# Patient Record
Sex: Male | Born: 1960 | Race: Black or African American | Hispanic: No | Marital: Single | Smoking: Current every day smoker
Health system: Southern US, Community
[De-identification: ages and names within clinical notes are randomized; demographics above are authoritative.]

## PROBLEM LIST (undated history)

## (undated) HISTORY — PX: OTHER SURGICAL HISTORY: SHX169

---

## 2018-02-21 ENCOUNTER — Emergency Department
Admission: EM | Admit: 2018-02-21 | Discharge: 2018-02-21 | Disposition: A | Discharge status: Home / Self Care | Payer: Self-pay | Attending: Emergency Medicine | Admitting: Emergency Medicine

## 2018-02-21 ENCOUNTER — Encounter: Payer: Self-pay | Admitting: Emergency Medicine

## 2018-02-21 DIAGNOSIS — R109 Unspecified abdominal pain: Secondary | ICD-10-CM

## 2018-02-21 DIAGNOSIS — F17210 Nicotine dependence, cigarettes, uncomplicated: Secondary | ICD-10-CM | POA: Insufficient documentation

## 2018-02-21 DIAGNOSIS — R1084 Generalized abdominal pain: Secondary | ICD-10-CM | POA: Insufficient documentation

## 2018-02-21 DIAGNOSIS — M799 Soft tissue disorder, unspecified: Secondary | ICD-10-CM | POA: Insufficient documentation

## 2018-02-21 LAB — URINALYSIS, COMPLETE (UACMP) WITH MICROSCOPIC
BILIRUBIN URINE: NEGATIVE
Bacteria, UA: NONE SEEN
Glucose, UA: NEGATIVE mg/dL
Hgb urine dipstick: NEGATIVE
KETONES UR: NEGATIVE mg/dL
Leukocytes, UA: NEGATIVE
Nitrite: NEGATIVE
PH: 7 (ref 5.0–8.0)
Protein, ur: NEGATIVE mg/dL
SPECIFIC GRAVITY, URINE: 1.013 (ref 1.005–1.030)
SQUAMOUS EPITHELIAL / LPF: NONE SEEN (ref 0–5)
WBC UA: NONE SEEN WBC/hpf (ref 0–5)

## 2018-02-21 LAB — BASIC METABOLIC PANEL
ANION GAP: 9 (ref 5–15)
BUN: 10 mg/dL (ref 6–20)
CALCIUM: 8.8 mg/dL — AB (ref 8.9–10.3)
CO2: 23 mmol/L (ref 22–32)
CREATININE: 0.89 mg/dL (ref 0.61–1.24)
Chloride: 108 mmol/L (ref 101–111)
GFR calc Af Amer: >60 mL/min (ref >60)
GFR calc non Af Amer: >60 mL/min (ref >60)
Glucose, Bld: 103 mg/dL — ABNORMAL HIGH (ref 65–99)
Potassium: 3.5 mmol/L (ref 3.5–5.1)
SODIUM: 140 mmol/L (ref 135–145)

## 2018-02-21 LAB — CBC
HCT: 39.4 % — ABNORMAL LOW (ref 40.0–52.0)
HEMOGLOBIN: 13.7 g/dL (ref 13.0–18.0)
MCH: 27.9 pg (ref 26.0–34.0)
MCHC: 34.9 g/dL (ref 32.0–36.0)
MCV: 80 fL (ref 80.0–100.0)
PLATELETS: 220 10*3/uL (ref 150–440)
RBC: 4.92 MIL/uL (ref 4.40–5.90)
RDW: 14.5 % (ref 11.5–14.5)
WBC: 11 10*3/uL — AB (ref 3.8–10.6)

## 2018-02-21 LAB — HEPATIC FUNCTION PANEL
ALBUMIN: 3.9 g/dL (ref 3.5–5.0)
ALK PHOS: 55 U/L (ref 38–126)
ALT: 20 U/L (ref 17–63)
AST: 22 U/L (ref 15–41)
Bilirubin, Direct: 0.1 mg/dL (ref 0.1–0.5)
Indirect Bilirubin: 0.9 mg/dL (ref 0.3–0.9)
TOTAL PROTEIN: 7.6 g/dL (ref 6.5–8.1)
Total Bilirubin: 1 mg/dL (ref 0.3–1.2)

## 2018-02-21 LAB — LIPASE, BLOOD: Lipase: 25 U/L (ref 11–51)

## 2018-02-21 MED ORDER — ONDANSETRON 4 MG PO TBDP: 4.0000 mg | ORAL_TABLET | Freq: Three times a day (TID) | ORAL | 0 refills | Status: DC | PRN

## 2018-02-21 MED ORDER — KETOROLAC TROMETHAMINE 60 MG/2ML IM SOLN
15.0000 mg | Freq: Once | INTRAMUSCULAR | Status: AC
  Administered 2018-02-21: 15 mg via INTRAMUSCULAR
  Filled 2018-02-21: qty 2

## 2018-02-21 MED ORDER — NAPROXEN 500 MG PO TABS: 500.0000 mg | ORAL_TABLET | Freq: Two times a day (BID) | ORAL | 0 refills | Status: DC

## 2018-02-21 NOTE — ED Triage Notes (Signed)
Pt arrived with complaints of right sided flank pain that started 2 days ago. Pt denies any difficulty urinating.

## 2018-02-21 NOTE — ED Notes (Signed)
DC instructions discussed with patient, informed him to follow up with [facility] if needed and call Dr. Dahlia Byes from surgery for removal of lump on back. Pt verbalized understanding.  Prescriptions given to patient.

## 2018-02-21 NOTE — ED Provider Notes (Addendum)
[facility] Emergency Department Provider Note  ____________________________________________  Time seen: Approximately 10:59 AM  I have reviewed the triage vital signs and the nursing notes.   HISTORY  Chief Complaint Flank Pain    HPI Julian Lopez is a 57 y.o. male with no significant past medical history who complains of right flank pain for the past 2 days.  Reports that it is constant, waxing waning, no aggravating or alleviating factors.  Feels sharp.  Nonradiating.      History reviewed. No pertinent past medical history.   There are no active problems to display for this patient.    History reviewed. No pertinent surgical history.   Prior to Admission medications   Medication Sig Start Date End Date Taking? Authorizing Provider  naproxen (NAPROSYN) 500 MG tablet Take 1 tablet (500 mg total) by mouth 2 (two) times daily with a meal. 02/21/18   Carrie Mew, MD  ondansetron (ZOFRAN ODT) 4 MG disintegrating tablet Take 1 tablet (4 mg total) by mouth every 8 (eight) hours as needed for nausea or vomiting. 02/21/18   Carrie Mew, MD     Allergies Patient has no allergy information on record.   No family history on file.  Social History Social History   Tobacco Use  . Smoking status: Current Every Day Smoker  . Smokeless tobacco: Never Used  Substance Use Topics  . Alcohol use: Yes  . Drug use: Never    Review of Systems  Constitutional:   No fever or chills.  ENT:   No sore throat. No rhinorrhea. Cardiovascular:   No chest pain or syncope. Respiratory:   No dyspnea or cough. Gastrointestinal:   Positive as above for flank pain without vomiting and diarrhea.  Normal bowel movement yesterday Musculoskeletal:   Negative for focal pain or swelling All other systems reviewed and are negative except as documented above in ROS and HPI.  ____________________________________________   PHYSICAL EXAM:  VITAL SIGNS: ED  Triage Vitals  Enc Vitals Group     BP 02/21/18 0726 (!) 129/92     Pulse Rate 02/21/18 0726 70     Resp 02/21/18 0726 16     Temp 02/21/18 0726 (!) 97.4 F (36.3 C)     Temp Source 02/21/18 0726 Oral     SpO2 02/21/18 0726 99 %     Weight 02/21/18 0727 180 lb (81.6 kg)     Height 02/21/18 0727 5' 9.5" (1.765 m)     Head Circumference --      Peak Flow --      Pain Score 02/21/18 0727 10     Pain Loc --      Pain Edu? --      Excl. in [place]? --     Vital signs reviewed, nursing assessments reviewed.   Constitutional:   Alert and oriented. Non-toxic appearance. Eyes:   Conjunctivae are normal. EOMI. PERRL. ENT      Head:   Normocephalic and atraumatic.      Nose:   No congestion/rhinnorhea.       Mouth/Throat:   MMM, no pharyngeal erythema. No peritonsillar mass.       Neck:   No meningismus. Full ROM. Hematological/Lymphatic/Immunilogical:   No cervical lymphadenopathy. Cardiovascular:   RRR. Symmetric bilateral radial and DP pulses.  No murmurs.  Respiratory:   Normal respiratory effort without tachypnea/retractions. Breath sounds are clear and equal bilaterally. No wheezes/rales/rhonchi. Gastrointestinal:   Soft and nontender. Non distended. There is no CVA  tenderness.  No rebound, rigidity, or guarding.  Musculoskeletal:   Normal range of motion in all extremities. No joint effusions.  No lower extremity tenderness.  No edema.  There is a 1 cm soft tissue nodule in the area of indicated pain that is exquisitely tender.  No inflammatory changes, no fluctuance.  No crepitus.  It is mobile. Neurologic:   Normal speech and language.  Motor grossly intact. No acute focal neurologic deficits are appreciated.  Skin:    Skin is warm, dry and intact. No rash noted.  No petechiae, purpura, or bullae.  ____________________________________________    LABS (pertinent positives/negatives) (all labs ordered are listed, but only abnormal results are displayed) Labs Reviewed   URINALYSIS, COMPLETE (UACMP) WITH MICROSCOPIC - Abnormal; Notable for the following components:      Result Value   Color, Urine YELLOW (*)    APPearance CLOUDY (*)    All other components within normal limits  BASIC METABOLIC PANEL - Abnormal; Notable for the following components:   Glucose, Bld 103 (*)    Calcium 8.8 (*)    All other components within normal limits  CBC - Abnormal; Notable for the following components:   WBC 11.0 (*)    HCT 39.4 (*)    All other components within normal limits  HEPATIC FUNCTION PANEL  LIPASE, BLOOD   ____________________________________________   EKG    ____________________________________________    RADIOLOGY  No results found.  ____________________________________________   PROCEDURES Procedures  ____________________________________________  DIFFERENTIAL DIAGNOSIS   Kidney stone, biliary colic, gas pain, constipation  CLINICAL IMPRESSION / ASSESSMENT AND PLAN / ED COURSE  Pertinent labs & imaging results that were available during my care of the patient were reviewed by me and considered in my medical decision making (see chart for details).      Clinical Course as of Feb 21 1122  Wed Feb 21, 2018  0923 Labs are normal.  Right upper quadrant tenderness suggestive of biliary disease, but patient has no underlying comorbidities or reason to be immunocompromised or have blunted symptoms.  He is eating normally, well-appearing, normal vital signs.  No imaging needed at this time, would plan to follow-up with primary care.   [PS]    Clinical Course User Index [PS] Carrie Mew, MD     ----------------------------------------- 11:05 AM on 02/21/2018 -----------------------------------------  On repeat assessment the patient is completely nontender although he reports the pain is still there.  Most likely due to asymptomatic soft tissue nodule which is almost certainly benign.  Considering the patient's symptoms,  medical history, and physical examination today, I have low suspicion for cholecystitis or biliary pathology, pancreatitis, perforation or bowel obstruction, hernia, intra-abdominal abscess, AAA or dissection, volvulus or intussusception, mesenteric ischemia, or appendicitis.  Patient instructed to follow-up in surgery clinic for further evaluation and possible excision if warranted.    ____________________________________________   FINAL CLINICAL IMPRESSION(S) / ED DIAGNOSES    Final diagnoses:  Right flank pain  Soft tissue nodule   ED Discharge Orders        Ordered    naproxen (NAPROSYN) 500 MG tablet  2 times daily with meals     02/21/18 1058    ondansetron (ZOFRAN ODT) 4 MG disintegrating tablet  Every 8 hours PRN     02/21/18 1058      Portions of this note were generated with dragon dictation software. Dictation errors may occur despite best attempts at proofreading.    Carrie Mew, MD 02/21/18 1105  Carrie Mew, MD 02/21/18 1123

## 2018-02-21 NOTE — Discharge Instructions (Addendum)
Labs today are unremarkable.  Your urine test also looks normal.  Follow-up with primary care to continue monitoring the symptoms.  Take naproxen and Zofran as needed.

## 2019-06-29 ENCOUNTER — Encounter: Payer: Self-pay | Admitting: Emergency Medicine

## 2019-06-29 ENCOUNTER — Observation Stay
Admission: EM | Admit: 2019-06-29 | Discharge: 2019-06-30 | Disposition: A | Discharge status: Home / Self Care | Payer: Self-pay | Attending: Internal Medicine | Admitting: Internal Medicine

## 2019-06-29 ENCOUNTER — Emergency Department: Payer: Self-pay

## 2019-06-29 ENCOUNTER — Other Ambulatory Visit: Payer: Self-pay

## 2019-06-29 DIAGNOSIS — Z791 Long term (current) use of non-steroidal anti-inflammatories (NSAID): Secondary | ICD-10-CM | POA: Insufficient documentation

## 2019-06-29 DIAGNOSIS — N400 Enlarged prostate without lower urinary tract symptoms: Secondary | ICD-10-CM | POA: Insufficient documentation

## 2019-06-29 DIAGNOSIS — Z79899 Other long term (current) drug therapy: Secondary | ICD-10-CM | POA: Insufficient documentation

## 2019-06-29 DIAGNOSIS — M62838 Other muscle spasm: Secondary | ICD-10-CM | POA: Insufficient documentation

## 2019-06-29 DIAGNOSIS — S2242XA Multiple fractures of ribs, left side, initial encounter for closed fracture: Principal | ICD-10-CM | POA: Insufficient documentation

## 2019-06-29 DIAGNOSIS — F17200 Nicotine dependence, unspecified, uncomplicated: Secondary | ICD-10-CM | POA: Insufficient documentation

## 2019-06-29 DIAGNOSIS — K449 Diaphragmatic hernia without obstruction or gangrene: Secondary | ICD-10-CM | POA: Insufficient documentation

## 2019-06-29 DIAGNOSIS — R0781 Pleurodynia: Secondary | ICD-10-CM

## 2019-06-29 DIAGNOSIS — S2239XA Fracture of one rib, unspecified side, initial encounter for closed fracture: Secondary | ICD-10-CM | POA: Diagnosis present

## 2019-06-29 DIAGNOSIS — M5136 Other intervertebral disc degeneration, lumbar region: Secondary | ICD-10-CM | POA: Insufficient documentation

## 2019-06-29 DIAGNOSIS — W010XXA Fall on same level from slipping, tripping and stumbling without subsequent striking against object, initial encounter: Secondary | ICD-10-CM | POA: Insufficient documentation

## 2019-06-29 DIAGNOSIS — Z20828 Contact with and (suspected) exposure to other viral communicable diseases: Secondary | ICD-10-CM | POA: Insufficient documentation

## 2019-06-29 DIAGNOSIS — W19XXXA Unspecified fall, initial encounter: Secondary | ICD-10-CM

## 2019-06-29 LAB — COMPREHENSIVE METABOLIC PANEL
ALT: 23 U/L (ref 0–44)
AST: 18 U/L (ref 15–41)
Albumin: 4.2 g/dL (ref 3.5–5.0)
Alkaline Phosphatase: 51 U/L (ref 38–126)
Anion gap: 9 (ref 5–15)
BUN: 13 mg/dL (ref 6–20)
CO2: 24 mmol/L (ref 22–32)
Calcium: 9.4 mg/dL (ref 8.9–10.3)
Chloride: 108 mmol/L (ref 98–111)
Creatinine, Ser: 1.19 mg/dL (ref 0.61–1.24)
GFR calc Af Amer: >60 mL/min (ref >60)
GFR calc non Af Amer: >60 mL/min (ref >60)
Glucose, Bld: 106 mg/dL — ABNORMAL HIGH (ref 70–99)
Potassium: 4.8 mmol/L (ref 3.5–5.1)
Sodium: 141 mmol/L (ref 135–145)
Total Bilirubin: 0.6 mg/dL (ref 0.3–1.2)
Total Protein: 8 g/dL (ref 6.5–8.1)

## 2019-06-29 LAB — CBC WITH DIFFERENTIAL/PLATELET
Abs Immature Granulocytes: 0.02 10*3/uL (ref 0.00–0.07)
Basophils Absolute: 0 10*3/uL (ref 0.0–0.1)
Basophils Relative: 0 %
Eosinophils Absolute: 0 10*3/uL (ref 0.0–0.5)
Eosinophils Relative: 0 %
HCT: 41.5 % (ref 39.0–52.0)
Hemoglobin: 13.5 g/dL (ref 13.0–17.0)
Immature Granulocytes: 0 %
Lymphocytes Relative: 16 %
Lymphs Abs: 1.1 10*3/uL (ref 0.7–4.0)
MCH: 26.5 pg (ref 26.0–34.0)
MCHC: 32.5 g/dL (ref 30.0–36.0)
MCV: 81.4 fL (ref 80.0–100.0)
Monocytes Absolute: 0.5 10*3/uL (ref 0.1–1.0)
Monocytes Relative: 7 %
Neutro Abs: 5.5 10*3/uL (ref 1.7–7.7)
Neutrophils Relative %: 77 %
Platelets: 276 10*3/uL (ref 150–400)
RBC: 5.1 MIL/uL (ref 4.22–5.81)
RDW: 14.1 % (ref 11.5–15.5)
WBC: 7.2 10*3/uL (ref 4.0–10.5)
nRBC: 0 % (ref 0.0–0.2)

## 2019-06-29 LAB — SARS CORONAVIRUS 2 (TAT 6-24 HRS): SARS Coronavirus 2: NEGATIVE

## 2019-06-29 MED ORDER — MORPHINE SULFATE (PF) 4 MG/ML IV SOLN
4.0000 mg | Freq: Once | INTRAVENOUS | Status: AC
  Administered 2019-06-29: 09:00:00 4 mg via INTRAVENOUS
  Filled 2019-06-29: qty 1

## 2019-06-29 MED ORDER — ENOXAPARIN SODIUM 40 MG/0.4ML ~~LOC~~ SOLN
40.0000 mg | SUBCUTANEOUS | Status: DC
  Administered 2019-06-29: 22:00:00 40 mg via SUBCUTANEOUS
  Filled 2019-06-29 (×2): qty 0.4

## 2019-06-29 MED ORDER — SENNOSIDES-DOCUSATE SODIUM 8.6-50 MG PO TABS
1.0000 | ORAL_TABLET | Freq: Every evening | ORAL | Status: DC | PRN
  Filled 2019-06-29: qty 1

## 2019-06-29 MED ORDER — SODIUM CHLORIDE 0.9% FLUSH
3.0000 mL | Freq: Two times a day (BID) | INTRAVENOUS | Status: DC
  Administered 2019-06-29 – 2019-06-30 (×2): 3 mL via INTRAVENOUS

## 2019-06-29 MED ORDER — CYCLOBENZAPRINE HCL 10 MG PO TABS
10.0000 mg | ORAL_TABLET | Freq: Once | ORAL | Status: AC
  Administered 2019-06-29: 10 mg via ORAL
  Filled 2019-06-29: qty 1

## 2019-06-29 MED ORDER — NICOTINE 21 MG/24HR TD PT24
21.0000 mg | MEDICATED_PATCH | Freq: Every day | TRANSDERMAL | Status: DC
  Administered 2019-06-29 – 2019-06-30 (×2): 21 mg via TRANSDERMAL
  Filled 2019-06-29 (×2): qty 1

## 2019-06-29 MED ORDER — IOHEXOL 300 MG/ML  SOLN
100.0000 mL | Freq: Once | INTRAMUSCULAR | Status: AC | PRN
  Administered 2019-06-29: 11:00:00 100 mL via INTRAVENOUS

## 2019-06-29 MED ORDER — FENTANYL CITRATE (PF) 100 MCG/2ML IJ SOLN
50.0000 ug | Freq: Once | INTRAMUSCULAR | Status: AC
  Administered 2019-06-29: 11:00:00 50 ug via INTRAVENOUS
  Filled 2019-06-29: qty 2

## 2019-06-29 MED ORDER — ONDANSETRON HCL 4 MG/2ML IJ SOLN: 4.0000 mg | Freq: Four times a day (QID) | INTRAMUSCULAR | Status: DC | PRN

## 2019-06-29 MED ORDER — KETOROLAC TROMETHAMINE 30 MG/ML IJ SOLN
INTRAMUSCULAR | Status: AC
  Filled 2019-06-29: qty 1

## 2019-06-29 MED ORDER — SODIUM CHLORIDE 0.9 % IV SOLN: 250.0000 mL | INTRAVENOUS | Status: DC | PRN

## 2019-06-29 MED ORDER — HYDROCODONE-ACETAMINOPHEN 5-325 MG PO TABS
1.0000 | ORAL_TABLET | ORAL | Status: DC | PRN
  Administered 2019-06-29 – 2019-06-30 (×2): 1 via ORAL
  Administered 2019-06-30: 2 via ORAL
  Filled 2019-06-29 (×2): qty 1
  Filled 2019-06-29: qty 2

## 2019-06-29 MED ORDER — KETOROLAC TROMETHAMINE 15 MG/ML IJ SOLN
15.0000 mg | Freq: Four times a day (QID) | INTRAMUSCULAR | Status: DC | PRN
  Administered 2019-06-29 (×2): 15 mg via INTRAVENOUS
  Filled 2019-06-29 (×2): qty 1

## 2019-06-29 MED ORDER — OXYCODONE-ACETAMINOPHEN 5-325 MG PO TABS
1.0000 | ORAL_TABLET | Freq: Once | ORAL | Status: AC
  Administered 2019-06-29: 1 via ORAL
  Filled 2019-06-29: qty 1

## 2019-06-29 MED ORDER — SODIUM CHLORIDE 0.9% FLUSH: 3.0000 mL | INTRAVENOUS | Status: DC | PRN

## 2019-06-29 MED ORDER — ACETAMINOPHEN 325 MG PO TABS: 650.0000 mg | ORAL_TABLET | Freq: Four times a day (QID) | ORAL | Status: DC | PRN

## 2019-06-29 MED ORDER — SODIUM CHLORIDE 0.9 % IV BOLUS
1000.0000 mL | Freq: Once | INTRAVENOUS | Status: AC
  Administered 2019-06-29: 1000 mL via INTRAVENOUS

## 2019-06-29 MED ORDER — CYCLOBENZAPRINE HCL 5 MG PO TABS
7.5000 mg | ORAL_TABLET | Freq: Three times a day (TID) | ORAL | Status: DC | PRN
  Administered 2019-06-29 – 2019-06-30 (×2): 7.5 mg via ORAL
  Filled 2019-06-29 (×3): qty 1.5

## 2019-06-29 MED ORDER — ACETAMINOPHEN 650 MG RE SUPP
650.0000 mg | Freq: Four times a day (QID) | RECTAL | Status: DC | PRN
  Filled 2019-06-29: qty 1

## 2019-06-29 MED ORDER — ONDANSETRON HCL 4 MG PO TABS
4.0000 mg | ORAL_TABLET | Freq: Four times a day (QID) | ORAL | Status: DC | PRN
  Filled 2019-06-29: qty 1

## 2019-06-29 MED ORDER — LIDOCAINE 5 % EX PTCH
1.0000 | MEDICATED_PATCH | CUTANEOUS | Status: DC
  Administered 2019-06-29 – 2019-06-30 (×2): 1 via TRANSDERMAL
  Filled 2019-06-29 (×2): qty 1

## 2019-06-29 NOTE — ED Triage Notes (Signed)
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 Pt states that he is having sharp pain in the left rib cage and thinks he may have some broken ribs. Pt was not seen at the time of the fall. Pt is obviously uncomfortable at time of triage, no respiratory distress noted. Pt denies ShOB.

## 2019-06-29 NOTE — ED Notes (Signed)
Patient given ED sandwich tray, milk and juice.

## 2019-06-29 NOTE — H&P (Signed)
[place] [facility] [place] NAME: Julian Lopez    MR#:  [id number]  DATE OF BIRTH:  May 23, 1961  DATE OF ADMISSION:  06/29/2019  PRIMARY CARE PHYSICIAN: Patient, No Pcp Per   REQUESTING/REFERRING PHYSICIAN:   CHIEF COMPLAINT:   Chief Complaint  Patient presents with  . Fall  . Rib pain    HISTORY OF PRESENT ILLNESS: Julian Lopez  is a 58 y.o. male with no significant past medical history presented to the emergency room for fall and rib cage pain.  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  His pain in the left lower chest wall as well as back secondary to fall.  Pain is sharp and aching in nature 8 out of 10 on a scale of 1-10.  Patient was given IV pain meds and oral pain medicine emergency room but pain still present.  Imaging studies did not show any pneumothorax only rib fractures noted.  PAST MEDICAL HISTORY:  No CAD, diabetes mellitus, hypertension  PAST SURGICAL HISTORY: None  SOCIAL HISTORY:  Social History   Tobacco Use  . Smoking status: Current Every Day Smoker  . Smokeless tobacco: Never Used  Substance Use Topics  . Alcohol use: Yes    FAMILY HISTORY: No history of copd, diabetes in family  DRUG ALLERGIES: No Known Allergies  REVIEW OF SYSTEMS:   CONSTITUTIONAL: No fever, fatigue or weakness.  EYES: No blurred or double vision.  EARS, NOSE, AND THROAT: No tinnitus or ear pain.  RESPIRATORY: No cough, shortness of breath, wheezing or hemoptysis.  Has rib cage pain Chest wall tenderness noted CARDIOVASCULAR: No chest pain, orthopnea, edema.  GASTROINTESTINAL: No nausea, vomiting, diarrhea or abdominal pain.  GENITOURINARY: No dysuria, hematuria.  ENDOCRINE: No polyuria, nocturia,  HEMATOLOGY: No anemia, easy bruising or bleeding SKIN: No rash or lesion. MUSCULOSKELETAL: No joint pain or arthritis.   NEUROLOGIC: No tingling,  numbness, weakness.  PSYCHIATRY: No anxiety or depression.   MEDICATIONS AT HOME:  Prior to Admission medications   Medication Sig Start Date End Date Taking? Authorizing Provider  acetaminophen (TYLENOL) 325 MG tablet Take 650 mg by mouth every 6 (six) hours as needed.   Yes [provider]      PHYSICAL EXAMINATION:   VITAL SIGNS: Blood pressure 112/83, pulse 64, temperature 97.6 F (36.4 C), temperature source Oral, resp. rate 16, height 5\' 9"  (1.753 m), weight 88.5 kg, SpO2 99 %.  GENERAL:  58 y.o.-year-old patient lying in the bed with no acute distress.  EYES: Pupils equal, round, reactive to light and accommodation. No scleral icterus. Extraocular muscles intact.  HEENT: Head atraumatic, normocephalic. Oropharynx and nasopharynx clear.  NECK:  Supple, no jugular venous distention. No thyroid enlargement, no tenderness.  LUNGS: Normal breath sounds bilaterally, no wheezing, rales,rhonchi or crepitation. No use of accessory muscles of respiration.  CARDIOVASCULAR: S1, S2 normal. No murmurs, rubs, or gallops.  Tenderness in left chest wall ABDOMEN: Soft, nontender, nondistended. Bowel sounds present. No organomegaly or mass.  EXTREMITIES: No pedal edema, cyanosis, or clubbing.  NEUROLOGIC: Cranial nerves II through XII are intact. Muscle strength 5/5 in all extremities. Sensation intact. Gait not checked.  PSYCHIATRIC: The patient is alert and oriented x 3.  SKIN: No obvious rash, lesion, or ulcer.   LABORATORY PANEL:   CBC Recent Labs  Lab 06/29/19 0909  WBC 7.2  HGB 13.5  HCT 41.5  PLT 276  MCV 81.4  MCH 26.5  MCHC 32.5  RDW 14.1  LYMPHSABS 1.1  MONOABS 0.5  EOSABS 0.0  BASOSABS 0.0   ------------------------------------------------------------------------------------------------------------------  Chemistries  Recent Labs  Lab 06/29/19 0909  NA 141  K 4.8  CL 108  CO2 24  GLUCOSE 106*  BUN 13  CREATININE 1.19  CALCIUM 9.4  AST 18  ALT 23   ALKPHOS 51  BILITOT 0.6   ------------------------------------------------------------------------------------------------------------------ estimated creatinine clearance is 76.3 mL/min (by C-G formula based on SCr of 1.19 mg/dL). ------------------------------------------------------------------------------------------------------------------ No results for input(s): TSH, T4TOTAL, T3FREE, THYROIDAB in the last 72 hours.  Invalid input(s): FREET3   Coagulation profile No results for input(s): INR, PROTIME in the last 168 hours. ------------------------------------------------------------------------------------------------------------------- No results for input(s): DDIMER in the last 72 hours. -------------------------------------------------------------------------------------------------------------------  Cardiac Enzymes No results for input(s): CKMB, TROPONINI, MYOGLOBIN in the last 168 hours.  Invalid input(s): CK ------------------------------------------------------------------------------------------------------------------ Invalid input(s): POCBNP  ---------------------------------------------------------------------------------------------------------------  Urinalysis    Component Value Date/Time   COLORURINE YELLOW (A) 02/21/2018 0729   APPEARANCEUR CLOUDY (A) 02/21/2018 0729   LABSPEC 1.013 02/21/2018 0729   PHURINE 7.0 02/21/2018 0729   GLUCOSEU NEGATIVE 02/21/2018 0729   HGBUR NEGATIVE 02/21/2018 0729   BILIRUBINUR NEGATIVE 02/21/2018 0729   KETONESUR NEGATIVE 02/21/2018 0729   PROTEINUR NEGATIVE 02/21/2018 0729   NITRITE NEGATIVE 02/21/2018 0729   LEUKOCYTESUR NEGATIVE 02/21/2018 0729     RADIOLOGY: Dg Ribs Unilateral W/chest Left  Result Date: 06/29/2019 CLINICAL DATA:  [place] 2 days ago.  Left posterior rib pain. EXAM: LEFT RIBS AND CHEST - 3+ VIEW COMPARISON:  None. FINDINGS: Linear density in left lower chest may represent mild atelectasis.  Otherwise, the lungs are clear. Negative for a pneumothorax. Heart and mediastinum are within normal limits. Trachea is midline. Left ribs are intact without a displaced fracture. Normal appearance of the left clavicle and left AC joint. No gross abnormality to the left shoulder. IMPRESSION: 1. No acute cardiopulmonary disease. 2. Negative for a displaced left rib fracture. Electronically Signed   By: Markus Daft M.D.   On: 06/29/2019 08:43   Ct Chest W Contrast  Result Date: 06/29/2019 CLINICAL DATA:  Post fall 2 days ago with persistent left-sided rib pain. EXAM: CT CHEST, ABDOMEN, AND PELVIS WITH CONTRAST TECHNIQUE: Multidetector CT imaging of the chest, abdomen and pelvis was performed following the standard protocol during bolus administration of intravenous contrast. CONTRAST:  17mL OMNIPAQUE IOHEXOL 300 MG/ML  SOLN COMPARISON:  Left rib radiographic series-earlier same day FINDINGS: CT CHEST FINDINGS Cardiovascular: Normal heart size.  No pericardial effusion. Although this examination was not tailored for the evaluation the pulmonary arteries, there are no discrete filling defects within the central pulmonary arterial tree to suggest central pulmonary embolism. Normal caliber of the main pulmonary artery. No evidence of thoracic aortic aneurysm or dissection on this nongated examination. Conventional configuration of the aortic arch. The branch vessels of the aortic arch appear patent throughout their imaged courses. Mediastinum/Nodes: No bulky mediastinal, hilar or axillary lymphadenopathy. There is a very minimal amount of ill-defined soft tissue within the anterior mediastinum without associated mass effect favored to represent residual thymic tissue. Lungs/Pleura: Minimal bibasilar dependent grossly symmetric subsegmental atelectasis. No discrete focal airspace opacities. No pleural effusion or pneumothorax. The central pulmonary airways appear widely patent. No discrete pulmonary nodules.  Musculoskeletal: Acute minimally displaced fractures involving the left 10th (image 50, series 2) and 11th (image 52) ribs. No associated radiopaque foreign body. No displaced sternal fracture. Stigmata of  DISH within the thoracic spine. _________________________________________________________ CT ABDOMEN PELVIS FINDINGS Hepatobiliary: Normal hepatic contour. There is a punctate (approximately 0.8 cm) ill-defined hypoattenuating lesion within the central aspect the right lobe of the liver (image 11, series 4), which is too small to accurately characterize though may represent a hepatic cyst. Normal appearance of the gallbladder given degree of distention. No radiopaque gallstones. No intra extrahepatic biliary ductal dilatation. No ascites. Pancreas: Normal appearance of the pancreas. Spleen: Normal appearance of the spleen. Note is made of a small splenule. No perisplenic stranding. Adrenals/Urinary Tract: There is symmetric enhancement and excretion of the bilateral kidneys. No definite renal stones on this postcontrast examination. Multiple parapelvic cysts are seen bilaterally. No discrete worrisome renal lesions. No urinary obstruction or perinephric stranding. Normal appearance of the bilateral adrenal glands. Normal appearance of the urinary bladder given degree distention. Stomach/Bowel: Moderate to large colonic stool burden without evidence of enteric obstruction. Normal appearance of the terminal ileum and retrocecal appendix. Small hiatal hernia. No discrete areas of bowel wall thickening. No pneumoperitoneum, pneumatosis or portal venous gas. Vascular/Lymphatic: Normal caliber of the abdominal aorta. The major branch vessels of the abdominal aorta appear patent on this non CTA examination. No bulky retroperitoneal, mesenteric, pelvic or inguinal lymphadenopathy. Reproductive: Borderline enlarged prostate gland. No free fluid the pelvic cul-de-sac. Other: Regional soft tissues appear normal.  Musculoskeletal: No acute or aggressive osseous abnormalities. Mild-to-moderate multilevel lumbar spine DDD, worse at L4-L5 with disc space height loss, endplate irregularity and sclerosis. IMPRESSION: 1. Acute minimally displaced fractures involving the posterior aspects of the left 10th and 11th ribs without associated radiopaque foreign body, pleural effusion or pneumothorax. 2. No additional acute findings within the chest, abdomen or pelvis. 3. Small hiatal hernia. 4. 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 Electronically Signed   By: Sandi Mariscal M.D.   On: 06/29/2019 11:07   Ct Abdomen Pelvis W Contrast  Result Date: 06/29/2019 CLINICAL DATA:  Post fall 2 days ago with persistent left-sided rib pain. EXAM: CT CHEST, ABDOMEN, AND PELVIS WITH CONTRAST TECHNIQUE: Multidetector CT imaging of the chest, abdomen and pelvis was performed following the standard protocol during bolus administration of intravenous contrast. CONTRAST:  140mL OMNIPAQUE IOHEXOL 300 MG/ML  SOLN COMPARISON:  Left rib radiographic series-earlier same day FINDINGS: CT CHEST FINDINGS Cardiovascular: Normal heart size.  No pericardial effusion. Although this examination was not tailored for the evaluation the pulmonary arteries, there are no discrete filling defects within the central pulmonary arterial tree to suggest central pulmonary embolism. Normal caliber of the main pulmonary artery. No evidence of thoracic aortic aneurysm or dissection on this nongated examination. Conventional configuration of the aortic arch. The branch vessels of the aortic arch appear patent throughout their imaged courses. Mediastinum/Nodes: No bulky mediastinal, hilar or axillary lymphadenopathy. There is a very minimal amount of ill-defined soft tissue within the anterior mediastinum without associated mass effect favored to represent residual thymic tissue. Lungs/Pleura: Minimal bibasilar dependent grossly symmetric subsegmental  atelectasis. No discrete focal airspace opacities. No pleural effusion or pneumothorax. The central pulmonary airways appear widely patent. No discrete pulmonary nodules. Musculoskeletal: Acute minimally displaced fractures involving the left 10th (image 50, series 2) and 11th (image 52) ribs. No associated radiopaque foreign body. No displaced sternal fracture. Stigmata of DISH within the thoracic spine. _________________________________________________________ CT ABDOMEN PELVIS FINDINGS Hepatobiliary: Normal hepatic contour. There is a punctate (approximately 0.8 cm) ill-defined hypoattenuating lesion within the central aspect the right lobe of the liver (image 11, series  4), which is too small to accurately characterize though may represent a hepatic cyst. Normal appearance of the gallbladder given degree of distention. No radiopaque gallstones. No intra extrahepatic biliary ductal dilatation. No ascites. Pancreas: Normal appearance of the pancreas. Spleen: Normal appearance of the spleen. Note is made of a small splenule. No perisplenic stranding. Adrenals/Urinary Tract: There is symmetric enhancement and excretion of the bilateral kidneys. No definite renal stones on this postcontrast examination. Multiple parapelvic cysts are seen bilaterally. No discrete worrisome renal lesions. No urinary obstruction or perinephric stranding. Normal appearance of the bilateral adrenal glands. Normal appearance of the urinary bladder given degree distention. Stomach/Bowel: Moderate to large colonic stool burden without evidence of enteric obstruction. Normal appearance of the terminal ileum and retrocecal appendix. Small hiatal hernia. No discrete areas of bowel wall thickening. No pneumoperitoneum, pneumatosis or portal venous gas. Vascular/Lymphatic: Normal caliber of the abdominal aorta. The major branch vessels of the abdominal aorta appear patent on this non CTA examination. No bulky retroperitoneal, mesenteric, pelvic  or inguinal lymphadenopathy. Reproductive: Borderline enlarged prostate gland. No free fluid the pelvic cul-de-sac. Other: Regional soft tissues appear normal. Musculoskeletal: No acute or aggressive osseous abnormalities. Mild-to-moderate multilevel lumbar spine DDD, worse at L4-L5 with disc space height loss, endplate irregularity and sclerosis. IMPRESSION: 1. Acute minimally displaced fractures involving the posterior aspects of the left 10th and 11th ribs without associated radiopaque foreign body, pleural effusion or pneumothorax. 2. No additional acute findings within the chest, abdomen or pelvis. 3. Small hiatal hernia. 4. 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 Electronically Signed   By: Sandi Mariscal M.D.   On: 06/29/2019 11:07    EKG: No orders found for this or any previous visit.  IMPRESSION AND PLAN:  58 year old male patient with no past medical history works as a Development worker, [employer] had a fall and left rib cage pain  -Left 10th and 11th rib fractures Admit patient to medical floor observation bed No evidence of pneumothorax Pulmonary toilet and incentive spirometry  -Chest wall pain secondary to rib fractures Oral narcotics and PRN IV narcotic medication for better control of pain  -DVT prophylaxis subcu Lovenox daily  -Tobacco abuse Tobacco cessation counseled to the patient for 6 minutes  Nicotine patch offered  All the records are reviewed and case discussed with ED provider. Management plans discussed with the patient, family and they are in agreement.  CODE STATUS:Full code  TOTAL TIME TAKING CARE OF THIS PATIENT: 53 minutes.    Saundra Shelling M.D on 06/29/2019 at 12:36 PM  Between 7am to 6pm - Pager - [phone]  After 6pm go to [email or web address] - password [email or web address] [facility]  Office  [phone]  CC: Primary care physician; Patient, No Pcp Per

## 2019-06-29 NOTE — Progress Notes (Signed)
Advanced care plan. Purpose of the Encounter: CODE STATUS Parties in Attendance:Patient Patient's Decision Capacity:Good Subjective/Patient's story: 58 y.o. male with no significant past medical history presented to the emergency room for fall and rib cage pain.  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  His pain in the left lower chest wall as well as back secondary to fall.  Pain is sharp and aching in nature 8 out of 10 on a scale of 1-10.  Patient was given IV pain meds and oral pain medicine emergency room but pain still present.  Imaging studies did not show any pneumothorax only rib fractures noted. Objective/Medical story Patient needs pain management with oral and IV as needed narcotics Pulmonary toilet and incentive spirometry Goals of care determination:  Advance care directives goals of care treatment plan discussed Patient wants everything done which includes CPR, intubation ventilator if the need arises CODE STATUS: Full code Time spent discussing advanced care planning: 16 minutes

## 2019-06-29 NOTE — ED Provider Notes (Signed)
[facility] Emergency Department Provider Note  ____________________________________________   First MD Initiated Contact with Patient 06/29/19 [phone]     (approximate)  I have reviewed the triage vital signs and the nursing notes.  History  Chief Complaint Fall and Rib pain    HPI Julian Lopez is a 58 y.o. male with no significant medical history who presents to the emergency department for left-sided rib pain.  Patient states 2 days ago he was doing some landscaping up on a wall and had a "freak accident" and fell.  He fell onto his left side.  He denies any head injury or loss of consciousness.  He denies any preceding presyncopal symptoms.  He suspects that he has rib fractures on the left.  He attempted to control his pain at home by himself, but this morning the pain was too severe, and therefore presented for evaluation.  Pain is sharp, 10/10 in severity, and located to the left lateral and posterior ribs.  He denies any difficulty breathing.  He denies any pain elsewhere related to the fall, denies headache, anterior or right sided chest pain, abdominal pain, back pain, or extremity pain.  He has been ambulatory without issue since.  He is not on any anticoagulation.   Past Medical Hx History reviewed. No pertinent past medical history.  Problem List There are no active problems to display for this patient.   Past Surgical Hx History reviewed. No pertinent surgical history.  Medications Prior to Admission medications   Medication Sig Start Date End Date Taking? Authorizing Provider  naproxen (NAPROSYN) 500 MG tablet Take 1 tablet (500 mg total) by mouth 2 (two) times daily with a meal. 02/21/18   Carrie Mew, MD  ondansetron (ZOFRAN ODT) 4 MG disintegrating tablet Take 1 tablet (4 mg total) by mouth every 8 (eight) hours as needed for nausea or vomiting. 02/21/18   Carrie Mew, MD    Allergies Patient has no known allergies.  Family  Hx No family history on file.  Social Hx Social History   Tobacco Use  . Smoking status: Current Every Day Smoker  . Smokeless tobacco: Never Used  Substance Use Topics  . Alcohol use: Yes  . Drug use: Never     Review of Systems  Constitutional: Negative for fever, chills. Eyes: Negative for visual changes. ENT: Negative for sore throat. Cardiovascular: Negative for chest pain. Respiratory: Negative for shortness of breath. Gastrointestinal: Negative for nausea, vomiting.  Genitourinary: Negative for dysuria. Musculoskeletal: + LEFT sided rib pain. Skin: Negative for rash. Neurological: Negative for for headaches.   Physical Exam  Vital Signs: ED Triage Vitals [06/29/19 0734]  Enc Vitals Group     BP 120/78     Pulse Rate 63     Resp 16     Temp 97.6 F (36.4 C)     Temp Source Oral     SpO2 96 %     Weight 195 lb (88.5 kg)     Height 5\' 9"  (1.753 m)     Head Circumference      Peak Flow      Pain Score 10     Pain Loc      Pain Edu?      Excl. in [place]?     Constitutional: Alert and oriented.  Head: Normocephalic. Atraumatic. Eyes: Conjunctivae clear. Sclera anicteric. Nose: No congestion. No rhinorrhea. Mouth/Throat: Mucous membranes are moist.  Neck: No stridor.  No midline C-spine tenderness. Cardiovascular: Normal rate,  regular rhythm. Extremities well perfused. Respiratory: Normal respiratory effort.  Lungs CTAB.  Intermittently splinting secondary to pain. Chest: Tender to palpation to the left inferior lateral and posterior ribs.  No crepitance.  No flail chest. Gastrointestinal: Soft. Non-tender. Non-distended.  Musculoskeletal: No lower extremity edema. No deformities. Neurologic:  Normal speech and language. No gross focal neurologic deficits are appreciated.  Skin: Skin is warm, dry and intact. No rash noted. Psychiatric: Mood and affect are appropriate for situation.  EKG  N/A   Radiology  XR: IMPRESSION:  1. No acute  cardiopulmonary disease.  2. Negative for a displaced left rib fracture.   CT: IMPRESSION:  1. Acute minimally displaced fractures involving the posterior  aspects of the left 10th and 11th ribs without associated radiopaque  foreign body, pleural effusion or pneumothorax.  2. No additional acute findings within the chest, abdomen or pelvis.  3. Small hiatal hernia.  4. 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    Procedures  Procedure(s) performed (including critical care):  Procedures   Initial Impression / Assessment and Plan / ED Course  58 y.o. male who presents to the ED for left-sided rib pain after a fall.  Ddx: rib fracture, contusion, muscle spasms  Plan: will start with XR, pain control, reassess  XR is negative for fracture.  Patient reports continued intermittent, sharp, stabbing pain despite pain control.  This may be secondary to muscle spasms, however given his fall, will proceed with CT imaging for further evaluation. IV pain medication. Patient agreeable with plan.  CT scan does reveal left posterior 10th and 11th rib fractures.  Unfortunately, patient is still reporting significant pain despite oxycodone, morphine, fentanyl, Flexeril, and lidocaine patch.  Discussed admission for further pain control and pulmonary toilet in order to avoid splinting, which he is agreeable with.  Will discuss with hospitalist for admission.    Final Clinical Impression(s) / ED Diagnosis  Final diagnoses:  Fall, initial encounter  Rib pain on left side  Closed fracture of multiple ribs of left side, initial encounter       Note:  This document was prepared using Dragon voice recognition software and may include unintentional dictation errors.   Lilia Pro., MD 06/29/19 [phone]

## 2019-06-29 NOTE — ED Notes (Signed)
Report given to Collie Siad RN

## 2019-06-29 NOTE — ED Notes (Signed)
Family at bedside. 

## 2019-06-29 NOTE — ED Notes (Signed)
This RN transported to room 147

## 2019-06-30 LAB — BASIC METABOLIC PANEL
Anion gap: 8 (ref 5–15)
BUN: 14 mg/dL (ref 6–20)
CO2: 24 mmol/L (ref 22–32)
Calcium: 8.7 mg/dL — ABNORMAL LOW (ref 8.9–10.3)
Chloride: 109 mmol/L (ref 98–111)
Creatinine, Ser: 1.13 mg/dL (ref 0.61–1.24)
GFR calc Af Amer: >60 mL/min (ref >60)
GFR calc non Af Amer: >60 mL/min (ref >60)
Glucose, Bld: 103 mg/dL — ABNORMAL HIGH (ref 70–99)
Potassium: 3.9 mmol/L (ref 3.5–5.1)
Sodium: 141 mmol/L (ref 135–145)

## 2019-06-30 LAB — HIV ANTIBODY (ROUTINE TESTING W REFLEX): HIV Screen 4th Generation wRfx: NONREACTIVE

## 2019-06-30 MED ORDER — HYDROCODONE-ACETAMINOPHEN 5-325 MG PO TABS: 1.0000 | ORAL_TABLET | ORAL | 0 refills | Status: AC | PRN

## 2019-06-30 MED ORDER — LIDOCAINE 5 % EX PTCH: 1.0000 | MEDICATED_PATCH | CUTANEOUS | 0 refills | Status: AC

## 2019-06-30 MED ORDER — NICOTINE 21 MG/24HR TD PT24: 21.0000 mg | MEDICATED_PATCH | Freq: Every day | TRANSDERMAL | 0 refills | Status: AC

## 2019-06-30 MED ORDER — CYCLOBENZAPRINE HCL 7.5 MG PO TABS: 7.5000 mg | ORAL_TABLET | Freq: Three times a day (TID) | ORAL | 0 refills | Status: AC | PRN

## 2019-06-30 NOTE — Discharge Summary (Signed)
[facility] [place] NAME: Julian Lopez    MR#:  [id number]  DATE OF BIRTH:  [birth year]  DATE OF ADMISSION:  06/29/2019 ADMITTING PHYSICIAN: Saundra Shelling, MD  DATE OF DISCHARGE: 06/30/2019  PRIMARY CARE PHYSICIAN: Patient, No Pcp Per    ADMISSION DIAGNOSIS:  Rib pain on left side [R07.81] Fall, initial encounter [W19.XXXA] Closed fracture of multiple ribs of left side, initial encounter [S22.42XA]  DISCHARGE DIAGNOSIS:  Active Problems:   Rib fracture   SECONDARY DIAGNOSIS:  none  HOSPITAL COURSE:    58 year old male with tobacco dependency presented after mechanical fall with left-sided pain.  1 rib fractures left side 10 and 11: This is etiology of patient's left-sided pain.  Patient will continue with as needed pain management.  Patient was discharged with incentive spirometer to prevent pneumonia as well.  2.Tobacco dependence: Patient is encouraged to quit smoking and willing to attempt to quit was assessed. Patient highly motivated.Counseling was provided for 4 minutes. Patient to be discharged with nicotine patch  DISCHARGE CONDITIONS AND DIET:  Stable Regular diet  CONSULTS OBTAINED:    DRUG ALLERGIES:  No Known Allergies  DISCHARGE MEDICATIONS:   Allergies as of 06/30/2019   No Known Allergies     Medication List    STOP taking these medications   acetaminophen 325 MG tablet Commonly known as: TYLENOL     TAKE these medications   cyclobenzaprine 7.5 MG tablet Commonly known as: FEXMID Take 1 tablet (7.5 mg total) by mouth 3 (three) times daily as needed for muscle spasms.   HYDROcodone-acetaminophen 5-325 MG tablet Commonly known as: NORCO/VICODIN Take 1-2 tablets by mouth every 4 (four) hours as needed for moderate pain.   lidocaine 5 % Commonly known as: LIDODERM Place 1 patch onto the skin daily. Remove & Discard patch within 12 hours or as directed by MD Start taking on: July 01, 2019    nicotine 21 mg/24hr patch Commonly known as: NICODERM CQ - dosed in mg/24 hours Place 1 patch (21 mg total) onto the skin daily. Start taking on: July 01, 2019         Today   CHIEF COMPLAINT:  Patient with left-sided rib pain muscle spasms   VITAL SIGNS:  Blood pressure 116/76, pulse (!) 57, temperature 97.7 F (36.5 C), temperature source Oral, resp. rate 18, height 5\' 9"  (1.753 m), weight 93.3 kg, SpO2 100 %.   REVIEW OF SYSTEMS:  Review of Systems  Constitutional: Negative.  Negative for chills, fever and malaise/fatigue.  HENT: Negative.  Negative for ear discharge, ear pain, hearing loss, nosebleeds and sore throat.   Eyes: Negative.  Negative for blurred vision and pain.  Respiratory: Negative.  Negative for cough, hemoptysis, shortness of breath and wheezing.   Cardiovascular: Negative.  Negative for chest pain, palpitations and leg swelling.  Gastrointestinal: Negative.  Negative for abdominal pain, blood in stool, diarrhea, nausea and vomiting.  Genitourinary: Negative.  Negative for dysuria.  Musculoskeletal: Negative for back pain.       Rib pain  Skin: Negative.   Neurological: Negative for dizziness, tremors, speech change, focal weakness, seizures and headaches.  Endo/Heme/Allergies: Negative.  Does not bruise/bleed easily.  Psychiatric/Behavioral: Negative.  Negative for depression, hallucinations and suicidal ideas.     PHYSICAL EXAMINATION:  GENERAL:  58 y.o.-year-old patient lying in the bed with no acute distress.  NECK:  Supple, no jugular venous distention. No thyroid enlargement, no tenderness.  LUNGS: Normal breath sounds bilaterally,  no wheezing, rales,rhonchi  No use of accessory muscles of respiration.  CARDIOVASCULAR: S1, S2 normal. No murmurs, rubs, or gallops.  ABDOMEN: Soft, non-tender, non-distended. Bowel sounds present. No organomegaly or mass.  EXTREMITIES: No pedal edema, cyanosis, or clubbing.  PSYCHIATRIC: The patient is alert  and oriented x 3.  SKIN: No obvious rash, lesion, or ulcer.   DATA REVIEW:   CBC Recent Labs  Lab 06/29/19 0909  WBC 7.2  HGB 13.5  HCT 41.5  PLT 276    Chemistries  Recent Labs  Lab 06/29/19 0909 06/30/19 0425  NA 141 141  K 4.8 3.9  CL 108 109  CO2 24 24  GLUCOSE 106* 103*  BUN 13 14  CREATININE 1.19 1.13  CALCIUM 9.4 8.7*  AST 18  --   ALT 23  --   ALKPHOS 51  --   BILITOT 0.6  --     Cardiac Enzymes No results for input(s): TROPONINI in the last 168 hours.  Microbiology Results  @MICRORSLT48 @  RADIOLOGY:  Dg Ribs Unilateral W/chest Left  Result Date: 06/29/2019 CLINICAL DATA:  [place] 2 days ago.  Left posterior rib pain. EXAM: LEFT RIBS AND CHEST - 3+ VIEW COMPARISON:  None. FINDINGS: Linear density in left lower chest may represent mild atelectasis. Otherwise, the lungs are clear. Negative for a pneumothorax. Heart and mediastinum are within normal limits. Trachea is midline. Left ribs are intact without a displaced fracture. Normal appearance of the left clavicle and left AC joint. No gross abnormality to the left shoulder. IMPRESSION: 1. No acute cardiopulmonary disease. 2. Negative for a displaced left rib fracture. Electronically Signed   By: Markus Daft M.D.   On: 06/29/2019 08:43   Ct Chest W Contrast  Result Date: 06/29/2019 CLINICAL DATA:  Post fall 2 days ago with persistent left-sided rib pain. EXAM: CT CHEST, ABDOMEN, AND PELVIS WITH CONTRAST TECHNIQUE: Multidetector CT imaging of the chest, abdomen and pelvis was performed following the standard protocol during bolus administration of intravenous contrast. CONTRAST:  154mL OMNIPAQUE IOHEXOL 300 MG/ML  SOLN COMPARISON:  Left rib radiographic series-earlier same day FINDINGS: CT CHEST FINDINGS Cardiovascular: Normal heart size.  No pericardial effusion. Although this examination was not tailored for the evaluation the pulmonary arteries, there are no discrete filling defects within the central pulmonary  arterial tree to suggest central pulmonary embolism. Normal caliber of the main pulmonary artery. No evidence of thoracic aortic aneurysm or dissection on this nongated examination. Conventional configuration of the aortic arch. The branch vessels of the aortic arch appear patent throughout their imaged courses. Mediastinum/Nodes: No bulky mediastinal, hilar or axillary lymphadenopathy. There is a very minimal amount of ill-defined soft tissue within the anterior mediastinum without associated mass effect favored to represent residual thymic tissue. Lungs/Pleura: Minimal bibasilar dependent grossly symmetric subsegmental atelectasis. No discrete focal airspace opacities. No pleural effusion or pneumothorax. The central pulmonary airways appear widely patent. No discrete pulmonary nodules. Musculoskeletal: Acute minimally displaced fractures involving the left 10th (image 50, series 2) and 11th (image 52) ribs. No associated radiopaque foreign body. No displaced sternal fracture. Stigmata of DISH within the thoracic spine. _________________________________________________________ CT ABDOMEN PELVIS FINDINGS Hepatobiliary: Normal hepatic contour. There is a punctate (approximately 0.8 cm) ill-defined hypoattenuating lesion within the central aspect the right lobe of the liver (image 11, series 4), which is too small to accurately characterize though may represent a hepatic cyst. Normal appearance of the gallbladder given degree of distention. No radiopaque gallstones. No intra extrahepatic biliary ductal  dilatation. No ascites. Pancreas: Normal appearance of the pancreas. Spleen: Normal appearance of the spleen. Note is made of a small splenule. No perisplenic stranding. Adrenals/Urinary Tract: There is symmetric enhancement and excretion of the bilateral kidneys. No definite renal stones on this postcontrast examination. Multiple parapelvic cysts are seen bilaterally. No discrete worrisome renal lesions. No urinary  obstruction or perinephric stranding. Normal appearance of the bilateral adrenal glands. Normal appearance of the urinary bladder given degree distention. Stomach/Bowel: Moderate to large colonic stool burden without evidence of enteric obstruction. Normal appearance of the terminal ileum and retrocecal appendix. Small hiatal hernia. No discrete areas of bowel wall thickening. No pneumoperitoneum, pneumatosis or portal venous gas. Vascular/Lymphatic: Normal caliber of the abdominal aorta. The major branch vessels of the abdominal aorta appear patent on this non CTA examination. No bulky retroperitoneal, mesenteric, pelvic or inguinal lymphadenopathy. Reproductive: Borderline enlarged prostate gland. No free fluid the pelvic cul-de-sac. Other: Regional soft tissues appear normal. Musculoskeletal: No acute or aggressive osseous abnormalities. Mild-to-moderate multilevel lumbar spine DDD, worse at L4-L5 with disc space height loss, endplate irregularity and sclerosis. IMPRESSION: 1. Acute minimally displaced fractures involving the posterior aspects of the left 10th and 11th ribs without associated radiopaque foreign body, pleural effusion or pneumothorax. 2. No additional acute findings within the chest, abdomen or pelvis. 3. Small hiatal hernia. 4. 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 Electronically Signed   By: Sandi Mariscal M.D.   On: 06/29/2019 11:07   Ct Abdomen Pelvis W Contrast  Result Date: 06/29/2019 CLINICAL DATA:  Post fall 2 days ago with persistent left-sided rib pain. EXAM: CT CHEST, ABDOMEN, AND PELVIS WITH CONTRAST TECHNIQUE: Multidetector CT imaging of the chest, abdomen and pelvis was performed following the standard protocol during bolus administration of intravenous contrast. CONTRAST:  179mL OMNIPAQUE IOHEXOL 300 MG/ML  SOLN COMPARISON:  Left rib radiographic series-earlier same day FINDINGS: CT CHEST FINDINGS Cardiovascular: Normal heart size.  No pericardial effusion.  Although this examination was not tailored for the evaluation the pulmonary arteries, there are no discrete filling defects within the central pulmonary arterial tree to suggest central pulmonary embolism. Normal caliber of the main pulmonary artery. No evidence of thoracic aortic aneurysm or dissection on this nongated examination. Conventional configuration of the aortic arch. The branch vessels of the aortic arch appear patent throughout their imaged courses. Mediastinum/Nodes: No bulky mediastinal, hilar or axillary lymphadenopathy. There is a very minimal amount of ill-defined soft tissue within the anterior mediastinum without associated mass effect favored to represent residual thymic tissue. Lungs/Pleura: Minimal bibasilar dependent grossly symmetric subsegmental atelectasis. No discrete focal airspace opacities. No pleural effusion or pneumothorax. The central pulmonary airways appear widely patent. No discrete pulmonary nodules. Musculoskeletal: Acute minimally displaced fractures involving the left 10th (image 50, series 2) and 11th (image 52) ribs. No associated radiopaque foreign body. No displaced sternal fracture. Stigmata of DISH within the thoracic spine. _________________________________________________________ CT ABDOMEN PELVIS FINDINGS Hepatobiliary: Normal hepatic contour. There is a punctate (approximately 0.8 cm) ill-defined hypoattenuating lesion within the central aspect the right lobe of the liver (image 11, series 4), which is too small to accurately characterize though may represent a hepatic cyst. Normal appearance of the gallbladder given degree of distention. No radiopaque gallstones. No intra extrahepatic biliary ductal dilatation. No ascites. Pancreas: Normal appearance of the pancreas. Spleen: Normal appearance of the spleen. Note is made of a small splenule. No perisplenic stranding. Adrenals/Urinary Tract: There is symmetric enhancement and excretion of the bilateral kidneys.  No  definite renal stones on this postcontrast examination. Multiple parapelvic cysts are seen bilaterally. No discrete worrisome renal lesions. No urinary obstruction or perinephric stranding. Normal appearance of the bilateral adrenal glands. Normal appearance of the urinary bladder given degree distention. Stomach/Bowel: Moderate to large colonic stool burden without evidence of enteric obstruction. Normal appearance of the terminal ileum and retrocecal appendix. Small hiatal hernia. No discrete areas of bowel wall thickening. No pneumoperitoneum, pneumatosis or portal venous gas. Vascular/Lymphatic: Normal caliber of the abdominal aorta. The major branch vessels of the abdominal aorta appear patent on this non CTA examination. No bulky retroperitoneal, mesenteric, pelvic or inguinal lymphadenopathy. Reproductive: Borderline enlarged prostate gland. No free fluid the pelvic cul-de-sac. Other: Regional soft tissues appear normal. Musculoskeletal: No acute or aggressive osseous abnormalities. Mild-to-moderate multilevel lumbar spine DDD, worse at L4-L5 with disc space height loss, endplate irregularity and sclerosis. IMPRESSION: 1. Acute minimally displaced fractures involving the posterior aspects of the left 10th and 11th ribs without associated radiopaque foreign body, pleural effusion or pneumothorax. 2. No additional acute findings within the chest, abdomen or pelvis. 3. Small hiatal hernia. 4. 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 Electronically Signed   By: Sandi Mariscal M.D.   On: 06/29/2019 11:07      Allergies as of 06/30/2019   No Known Allergies     Medication List    STOP taking these medications   acetaminophen 325 MG tablet Commonly known as: TYLENOL     TAKE these medications   cyclobenzaprine 7.5 MG tablet Commonly known as: FEXMID Take 1 tablet (7.5 mg total) by mouth 3 (three) times daily as needed for muscle spasms.   HYDROcodone-acetaminophen 5-325 MG  tablet Commonly known as: NORCO/VICODIN Take 1-2 tablets by mouth every 4 (four) hours as needed for moderate pain.   lidocaine 5 % Commonly known as: LIDODERM Place 1 patch onto the skin daily. Remove & Discard patch within 12 hours or as directed by MD Start taking on: July 01, 2019   nicotine 21 mg/24hr patch Commonly known as: NICODERM CQ - dosed in mg/24 hours Place 1 patch (21 mg total) onto the skin daily. Start taking on: July 01, 2019           Management plans discussed with the patient and he is in agreement. Stable for discharge home  [facility]  CODE STATUS:     Code Status Orders  (From admission, onward)         Start     Ordered   06/29/19 1521  Full code  Continuous     06/29/19 1520        Code Status History    This patient has a current code status but no historical code status.   Advance Care Planning Activity      TOTAL TIME TAKING CARE OF THIS PATIENT: 38 minutes.    Note: This dictation was prepared with Dragon dictation along with smaller phrase technology. Any transcriptional errors that result from this process are unintentional.  Bettey Costa M.D on 06/30/2019 at 9:13 AM  Between 7am to 6pm - Pager - [phone] After 6pm go to [email or web address] - password [email or web address] [facility]  Office  [phone]  CC: Primary care physician; Patient, No Pcp Per

## 2019-06-30 NOTE — Progress Notes (Signed)
Discharge instructions reviewed with pt. Pt verbalized understanding of discharge instructions. Discharge packet given to pt. IV discontinued prior to discharge. Pt escorted out by this nurse

## 2020-01-18 ENCOUNTER — Inpatient Hospital Stay (HOSPITAL_COMMUNITY)
Admission: EM | Admit: 2020-01-18 | Discharge: 2020-01-21 | DRG: 184 | Disposition: A | Discharge status: Home / Self Care | Payer: Self-pay | Attending: General Surgery | Admitting: General Surgery

## 2020-01-18 ENCOUNTER — Other Ambulatory Visit: Payer: Self-pay

## 2020-01-18 ENCOUNTER — Emergency Department (HOSPITAL_COMMUNITY): Payer: Self-pay

## 2020-01-18 ENCOUNTER — Encounter (HOSPITAL_COMMUNITY): Payer: Self-pay | Admitting: Emergency Medicine

## 2020-01-18 DIAGNOSIS — F17210 Nicotine dependence, cigarettes, uncomplicated: Secondary | ICD-10-CM | POA: Diagnosis present

## 2020-01-18 DIAGNOSIS — K449 Diaphragmatic hernia without obstruction or gangrene: Secondary | ICD-10-CM | POA: Diagnosis present

## 2020-01-18 DIAGNOSIS — T07XXXA Unspecified multiple injuries, initial encounter: Secondary | ICD-10-CM

## 2020-01-18 DIAGNOSIS — Z8673 Personal history of transient ischemic attack (TIA), and cerebral infarction without residual deficits: Secondary | ICD-10-CM

## 2020-01-18 DIAGNOSIS — S2241XA Multiple fractures of ribs, right side, initial encounter for closed fracture: Principal | ICD-10-CM | POA: Diagnosis present

## 2020-01-18 DIAGNOSIS — S01511A Laceration without foreign body of lip, initial encounter: Secondary | ICD-10-CM | POA: Diagnosis present

## 2020-01-18 DIAGNOSIS — J939 Pneumothorax, unspecified: Secondary | ICD-10-CM

## 2020-01-18 DIAGNOSIS — S2231XA Fracture of one rib, right side, initial encounter for closed fracture: Secondary | ICD-10-CM | POA: Diagnosis present

## 2020-01-18 DIAGNOSIS — R52 Pain, unspecified: Secondary | ICD-10-CM

## 2020-01-18 DIAGNOSIS — Z20822 Contact with and (suspected) exposure to covid-19: Secondary | ICD-10-CM | POA: Diagnosis present

## 2020-01-18 DIAGNOSIS — S060X9A Concussion with loss of consciousness of unspecified duration, initial encounter: Secondary | ICD-10-CM | POA: Diagnosis present

## 2020-01-18 DIAGNOSIS — Y92410 Unspecified street and highway as the place of occurrence of the external cause: Secondary | ICD-10-CM

## 2020-01-18 DIAGNOSIS — S52612A Displaced fracture of left ulna styloid process, initial encounter for closed fracture: Secondary | ICD-10-CM | POA: Diagnosis present

## 2020-01-18 DIAGNOSIS — S270XXA Traumatic pneumothorax, initial encounter: Secondary | ICD-10-CM | POA: Diagnosis present

## 2020-01-18 LAB — LACTIC ACID, PLASMA: Lactic Acid, Venous: 2.4 mmol/L (ref 0.5–1.9)

## 2020-01-18 LAB — CBC
HCT: 43.7 % (ref 39.0–52.0)
Hemoglobin: 13.7 g/dL (ref 13.0–17.0)
MCH: 26 pg (ref 26.0–34.0)
MCHC: 31.4 g/dL (ref 30.0–36.0)
MCV: 82.9 fL (ref 80.0–100.0)
Platelets: 294 10*3/uL (ref 150–400)
RBC: 5.27 MIL/uL (ref 4.22–5.81)
RDW: 13.9 % (ref 11.5–15.5)
WBC: 10.8 10*3/uL — ABNORMAL HIGH (ref 4.0–10.5)
nRBC: 0 % (ref 0.0–0.2)

## 2020-01-18 LAB — COMPREHENSIVE METABOLIC PANEL
ALT: 43 U/L (ref 0–44)
AST: 34 U/L (ref 15–41)
Albumin: 3.9 g/dL (ref 3.5–5.0)
Alkaline Phosphatase: 59 U/L (ref 38–126)
Anion gap: 11 (ref 5–15)
BUN: 9 mg/dL (ref 6–20)
CO2: 23 mmol/L (ref 22–32)
Calcium: 9 mg/dL (ref 8.9–10.3)
Chloride: 105 mmol/L (ref 98–111)
Creatinine, Ser: 1.18 mg/dL (ref 0.61–1.24)
GFR calc Af Amer: >60 mL/min (ref >60)
GFR calc non Af Amer: >60 mL/min (ref >60)
Glucose, Bld: 101 mg/dL — ABNORMAL HIGH (ref 70–99)
Potassium: 3.6 mmol/L (ref 3.5–5.1)
Sodium: 139 mmol/L (ref 135–145)
Total Bilirubin: 0.5 mg/dL (ref 0.3–1.2)
Total Protein: 7.9 g/dL (ref 6.5–8.1)

## 2020-01-18 LAB — RESPIRATORY PANEL BY RT PCR (FLU A&B, COVID)
Influenza A by PCR: NEGATIVE
Influenza B by PCR: NEGATIVE
SARS Coronavirus 2 by RT PCR: NEGATIVE

## 2020-01-18 LAB — I-STAT CHEM 8, ED
BUN: 11 mg/dL (ref 6–20)
Calcium, Ion: 1.15 mmol/L (ref 1.15–1.40)
Chloride: 107 mmol/L (ref 98–111)
Creatinine, Ser: 1.1 mg/dL (ref 0.61–1.24)
Glucose, Bld: 94 mg/dL (ref 70–99)
HCT: 42 % (ref 39.0–52.0)
Hemoglobin: 14.3 g/dL (ref 13.0–17.0)
Potassium: 3.7 mmol/L (ref 3.5–5.1)
Sodium: 141 mmol/L (ref 135–145)
TCO2: 24 mmol/L (ref 22–32)

## 2020-01-18 LAB — URINALYSIS, ROUTINE W REFLEX MICROSCOPIC
Bilirubin Urine: NEGATIVE
Glucose, UA: NEGATIVE mg/dL
Hgb urine dipstick: NEGATIVE
Ketones, ur: NEGATIVE mg/dL
Leukocytes,Ua: NEGATIVE
Nitrite: NEGATIVE
Protein, ur: NEGATIVE mg/dL
Specific Gravity, Urine: 1.039 — ABNORMAL HIGH (ref 1.005–1.030)
pH: 5 (ref 5.0–8.0)

## 2020-01-18 LAB — SAMPLE TO BLOOD BANK

## 2020-01-18 LAB — PROTIME-INR
INR: 1.1 (ref 0.8–1.2)
Prothrombin Time: 13.7 seconds (ref 11.4–15.2)

## 2020-01-18 LAB — ETHANOL: Alcohol, Ethyl (B): <10 mg/dL (ref <10)

## 2020-01-18 MED ORDER — ONDANSETRON 4 MG PO TBDP: 4.0000 mg | ORAL_TABLET | Freq: Four times a day (QID) | ORAL | Status: DC | PRN

## 2020-01-18 MED ORDER — METOPROLOL TARTRATE 5 MG/5ML IV SOLN: 5.0000 mg | Freq: Four times a day (QID) | INTRAVENOUS | Status: DC | PRN

## 2020-01-18 MED ORDER — HYDROMORPHONE HCL 1 MG/ML IJ SOLN: 1.0000 mg | INTRAMUSCULAR | Status: DC | PRN

## 2020-01-18 MED ORDER — ACETAMINOPHEN 325 MG PO TABS
650.0000 mg | ORAL_TABLET | Freq: Four times a day (QID) | ORAL | Status: DC
  Administered 2020-01-19 – 2020-01-21 (×10): 650 mg via ORAL
  Filled 2020-01-18 (×9): qty 2

## 2020-01-18 MED ORDER — GABAPENTIN 100 MG PO CAPS
100.0000 mg | ORAL_CAPSULE | Freq: Three times a day (TID) | ORAL | Status: DC
  Administered 2020-01-18 – 2020-01-21 (×8): 100 mg via ORAL
  Filled 2020-01-18 (×8): qty 1

## 2020-01-18 MED ORDER — METHOCARBAMOL 1000 MG/10ML IJ SOLN
1000.0000 mg | Freq: Three times a day (TID) | INTRAVENOUS | Status: DC | PRN
  Administered 2020-01-19: 1000 mg via INTRAVENOUS
  Filled 2020-01-18 (×3): qty 10

## 2020-01-18 MED ORDER — ENOXAPARIN SODIUM 30 MG/0.3ML ~~LOC~~ SOLN
30.0000 mg | Freq: Two times a day (BID) | SUBCUTANEOUS | Status: DC
  Administered 2020-01-19 – 2020-01-21 (×5): 30 mg via SUBCUTANEOUS
  Filled 2020-01-18 (×5): qty 0.3

## 2020-01-18 MED ORDER — ONDANSETRON HCL 4 MG/2ML IJ SOLN: 4.0000 mg | Freq: Four times a day (QID) | INTRAMUSCULAR | Status: DC | PRN

## 2020-01-18 MED ORDER — SODIUM CHLORIDE 0.9 % IV BOLUS
125.0000 mL | Freq: Once | INTRAVENOUS | Status: AC
  Administered 2020-01-18: 125 mL via INTRAVENOUS

## 2020-01-18 MED ORDER — OXYCODONE HCL 5 MG PO TABS
10.0000 mg | ORAL_TABLET | ORAL | Status: DC | PRN
  Administered 2020-01-20 – 2020-01-21 (×5): 10 mg via ORAL
  Filled 2020-01-18 (×5): qty 2

## 2020-01-18 MED ORDER — OXYCODONE HCL 5 MG PO TABS
5.0000 mg | ORAL_TABLET | ORAL | Status: DC | PRN
  Administered 2020-01-19 – 2020-01-20 (×3): 5 mg via ORAL
  Filled 2020-01-18 (×3): qty 1

## 2020-01-18 MED ORDER — POTASSIUM CHLORIDE IN NACL 20-0.9 MEQ/L-% IV SOLN
INTRAVENOUS | Status: DC
  Filled 2020-01-18 (×3): qty 1000

## 2020-01-18 MED ORDER — HYDROMORPHONE HCL 1 MG/ML IJ SOLN
1.0000 mg | INTRAMUSCULAR | Status: DC | PRN
  Administered 2020-01-18 (×2): 1 mg via INTRAVENOUS
  Filled 2020-01-18 (×2): qty 1

## 2020-01-18 MED ORDER — IOHEXOL 300 MG/ML  SOLN
100.0000 mL | Freq: Once | INTRAMUSCULAR | Status: AC | PRN
  Administered 2020-01-18: 100 mL via INTRAVENOUS

## 2020-01-18 MED ORDER — FENTANYL CITRATE (PF) 100 MCG/2ML IJ SOLN
50.0000 ug | Freq: Once | INTRAMUSCULAR | Status: AC
  Administered 2020-01-18: 50 ug via INTRAVENOUS
  Filled 2020-01-18: qty 2
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
## 2020-01-18 NOTE — ED Notes (Signed)
Arville Go Mom  [id number] Kenney Houseman sister [id number] looking for an update on the pt

## 2020-01-18 NOTE — ED Notes (Signed)
Update given to sister via telephone. Mother is en route to hospital
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
## 2020-01-18 NOTE — ED Notes (Signed)
Attempted to call mother and sister with no response

## 2020-01-19 ENCOUNTER — Inpatient Hospital Stay (HOSPITAL_COMMUNITY): Payer: Self-pay

## 2020-01-19 DIAGNOSIS — S52612A Displaced fracture of left ulna styloid process, initial encounter for closed fracture: Secondary | ICD-10-CM | POA: Diagnosis present

## 2020-01-19 LAB — BASIC METABOLIC PANEL
Anion gap: 9 (ref 5–15)
BUN: 10 mg/dL (ref 6–20)
CO2: 24 mmol/L (ref 22–32)
Calcium: 9 mg/dL (ref 8.9–10.3)
Chloride: 107 mmol/L (ref 98–111)
Creatinine, Ser: 0.98 mg/dL (ref 0.61–1.24)
GFR calc Af Amer: >60 mL/min (ref >60)
GFR calc non Af Amer: >60 mL/min (ref >60)
Glucose, Bld: 117 mg/dL — ABNORMAL HIGH (ref 70–99)
Potassium: 3.9 mmol/L (ref 3.5–5.1)
Sodium: 140 mmol/L (ref 135–145)

## 2020-01-19 LAB — CBC
HCT: 38.7 % — ABNORMAL LOW (ref 39.0–52.0)
Hemoglobin: 12.3 g/dL — ABNORMAL LOW (ref 13.0–17.0)
MCH: 25.9 pg — ABNORMAL LOW (ref 26.0–34.0)
MCHC: 31.8 g/dL (ref 30.0–36.0)
MCV: 81.5 fL (ref 80.0–100.0)
Platelets: 260 10*3/uL (ref 150–400)
RBC: 4.75 MIL/uL (ref 4.22–5.81)
RDW: 13.9 % (ref 11.5–15.5)
WBC: 9.1 10*3/uL (ref 4.0–10.5)
nRBC: 0 % (ref 0.0–0.2)

## 2020-01-19 LAB — HIV ANTIBODY (ROUTINE TESTING W REFLEX): HIV Screen 4th Generation wRfx: NONREACTIVE
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
## 2020-01-19 NOTE — Progress Notes (Signed)
Subjective/Chief Complaint: Complains of severe rib pain. Hard to move. Left wrist hurts as well   Objective: Vital signs in last 24 hours: Temp:  [98.1 F (36.7 C)-98.7 F (37.1 C)] 98.4 F (36.9 C) (05/09 0755) Pulse Rate:  [66-78] 66 (05/09 0755) Resp:  [12-28] 20 (05/09 0755) BP: (102-121)/(64-79) 117/78 (05/09 0755) SpO2:  [92 %-100 %] 99 % (05/09 0755) Weight:  [90.7 kg] 90.7 kg (05/08 1611) Last BM Date: 01/18/20  Intake/Output from previous day: 05/08 0701 - 05/09 0700 In: 300 [I.V.:300] Out: 450 [Urine:450] Intake/Output this shift: Total I/O In: 600 [P.O.:600] Out: 800 [Urine:800]  General appearance: alert and cooperative Resp: clear to auscultation bilaterally and with increased work of breathing Cardio: regular rate and rhythm GI: soft, nontender  Lab Results:  Recent Labs    01/18/20 1627 01/19/20 0613  WBC 10.8* 9.1  HGB 13.7 12.3*  HCT 43.7 38.7*  PLT 294 260   BMET Recent Labs    01/18/20 1627 01/19/20 0613  NA 139 140  K 3.6 3.9  CL 105 107  CO2 23 24  GLUCOSE 101* 117*  BUN 9 10  CREATININE 1.18 0.98  CALCIUM 9.0 9.0   PT/INR Recent Labs    01/18/20 1627  LABPROT 13.7  INR 1.1   ABG No results for input(s): PHART, HCO3 in the last 72 hours.  Invalid input(s): PCO2, PO2  Studies/Results: DG Wrist 2 Views Left  Result Date: 01/18/2020 CLINICAL DATA:  Status post trauma. EXAM: LEFT WRIST - 2 VIEW COMPARISON:  None. FINDINGS: Acute mildly displaced fracture is seen involving the left ulnar styloid. There is no evidence of dislocation. There is no evidence of arthropathy or other focal bone abnormality. Mild soft tissue swelling is seen adjacent to the previously noted fracture site. IMPRESSION: Acute fracture of the left ulnar styloid. Electronically Signed   By: Virgina Norfolk M.D.   On: 01/18/2020 16:51   CT HEAD WO CONTRAST  Result Date: 01/18/2020 CLINICAL DATA:  Status post trauma. EXAM: CT HEAD WITHOUT CONTRAST CT  MAXILLOFACIAL WITHOUT CONTRAST TECHNIQUE: Multidetector CT imaging of the head and maxillofacial structures were performed using the standard protocol without intravenous contrast. Multiplanar CT image reconstructions of the maxillofacial structures were also generated. COMPARISON:  None. FINDINGS: CT HEAD FINDINGS Brain: No evidence of acute infarction, hemorrhage, hydrocephalus, extra-axial collection or mass effect. A 1.0 cm x 0.9 cm x 1.0 cm area of white matter low attenuation is seen within the posterolateral aspect of the cerebellum on the right (axial CT image 9 and 10, CT series number 4). Vascular: No hyperdense vessel or unexpected calcification. Skull: Normal. Negative for fracture or focal lesion. Other: Mild left frontal scalp soft tissue swelling is noted. There is marked severity bilateral maxillary sinus mucosal thickening, with poorly defined medial walls of the bilateral maxillary sinuses. Moderate severity bilateral ethmoid sinus mucosal thickening is also seen. CT MAXILLOFACIAL FINDINGS Osseous: No fracture or mandibular dislocation. No destructive process. Orbits: Negative. No traumatic or inflammatory finding. Sinuses: There is marked severity bilateral maxillary sinus mucosal thickening, with poorly defined medial walls of the bilateral maxillary sinuses. Moderate severity bilateral ethmoid sinus mucosal thickening is also seen. Soft tissues: There is mild left frontal scalp soft tissue swelling. IMPRESSION: 1. Small area of white matter low attenuation within the posterolateral aspect of the cerebellum on the right, which may represent an area of chronic infarct. Correlation with MRI is recommended. 2. Mild left frontal scalp soft tissue swelling. 3. Bilateral maxillary sinus  and bilateral ethmoid sinus disease. Electronically Signed   By: Virgina Norfolk M.D.   On: 01/18/2020 17:34   CT CHEST W CONTRAST  Result Date: 01/18/2020 CLINICAL DATA:  Status post trauma. EXAM: CT CHEST,  ABDOMEN, AND PELVIS WITH CONTRAST TECHNIQUE: Multidetector CT imaging of the chest, abdomen and pelvis was performed following the standard protocol during bolus administration of intravenous contrast. CONTRAST:  150mL OMNIPAQUE IOHEXOL 300 MG/ML  SOLN COMPARISON:  None. FINDINGS: CT CHEST FINDINGS Cardiovascular: No significant vascular findings. Normal heart size. No pericardial effusion. Mediastinum/Nodes: No enlarged mediastinal, hilar, or axillary lymph nodes. Thyroid gland, trachea, and esophagus demonstrate no significant findings. Lungs/Pleura: Very mild scarring and/or atelectasis is seen along the posterior aspect of the right middle lobe. Small adjacent thin wall cysts are seen along the major fissure on the right. There is a trace amount of nonhemorrhagic pleural fluid seen on the right. A small anterior right pneumothorax is seen. This extends from the right apex to the right lung base and measures approximately 1.5 cm in maximum AP measurement). Musculoskeletal: Acute lateral fourth, fifth and sixth right rib fractures are seen. An additional posterior fourth right rib fracture is noted. A chronic posterolateral tenth left rib fracture is seen. CT ABDOMEN PELVIS FINDINGS Hepatobiliary: No focal liver abnormality is seen. No gallstones, gallbladder wall thickening, or biliary dilatation. Pancreas: Unremarkable. No pancreatic ductal dilatation or surrounding inflammatory changes. Spleen: Normal in size without focal abnormality. Adrenals/Urinary Tract: Adrenal glands are unremarkable. Kidneys are normal, without renal calculi, focal lesion, or hydronephrosis. Bladder is unremarkable. Stomach/Bowel: There is a moderate to large hiatal hernia. Appendix appears normal. No evidence of bowel wall thickening, distention, or inflammatory changes. Noninflamed diverticula are seen within the proximal sigmoid colon. Vascular/Lymphatic: No significant vascular findings are present. No enlarged abdominal or pelvic  lymph nodes. Reproductive: Prostate is unremarkable. Other: No abdominal wall hernia or abnormality. No abdominopelvic ascites. Musculoskeletal: No acute or significant osseous findings. IMPRESSION: 1. Small anterior right pneumothorax. 2. Acute lateral fourth, fifth and sixth right rib fractures. 3. Trace amount of nonhemorrhagic pleural fluid seen on the right. 4. Moderate to large hiatal hernia. 5. Noninflamed diverticula within the proximal sigmoid colon. Electronically Signed   By: Virgina Norfolk M.D.   On: 01/18/2020 17:49   CT CERVICAL SPINE WO CONTRAST  Result Date: 01/18/2020 CLINICAL DATA:  Status post trauma. EXAM: CT CERVICAL SPINE WITHOUT CONTRAST TECHNIQUE: Multidetector CT imaging of the cervical spine was performed without intravenous contrast. Multiplanar CT image reconstructions were also generated. COMPARISON:  None. FINDINGS: Alignment: Normal. Skull base and vertebrae: No acute fracture. No primary bone lesion or focal pathologic process. Soft tissues and spinal canal: No prevertebral fluid or swelling. No visible canal hematoma. Disc levels: Very early anterior osteophyte formation is seen at the levels of C4-C5 and C5-C6. Normal multilevel intervertebral disc spaces are noted. Upper chest: Negative. Other: Marked severity bilateral maxillary sinus disease is seen. IMPRESSION: 1. Very early anterior osteophyte formation at the levels of C4-C5 and C5-C6. 2. Marked severity bilateral maxillary sinus disease. Electronically Signed   By: Virgina Norfolk M.D.   On: 01/18/2020 17:37   CT ABDOMEN PELVIS W CONTRAST  Result Date: 01/18/2020 CLINICAL DATA:  Status post trauma. EXAM: CT CHEST, ABDOMEN, AND PELVIS WITH CONTRAST TECHNIQUE: Multidetector CT imaging of the chest, abdomen and pelvis was performed following the standard protocol during bolus administration of intravenous contrast. CONTRAST:  115mL OMNIPAQUE IOHEXOL 300 MG/ML  SOLN COMPARISON:  None. FINDINGS: CT CHEST FINDINGS  Cardiovascular: No significant vascular findings. Normal heart size. No pericardial effusion. Mediastinum/Nodes: No enlarged mediastinal, hilar, or axillary lymph nodes. Thyroid gland, trachea, and esophagus demonstrate no significant findings. Lungs/Pleura: Very mild scarring and/or atelectasis is seen along the posterior aspect of the right middle lobe. Small adjacent thin wall cysts are seen along the major fissure on the right. There is a trace amount of nonhemorrhagic pleural fluid seen on the right. A small anterior right pneumothorax is seen. This extends from the right apex to the right lung base and measures approximately 1.5 cm in maximum AP measurement). Musculoskeletal: Acute lateral fourth, fifth and sixth right rib fractures are seen. An additional posterior fourth right rib fracture is noted. A chronic posterolateral tenth left rib fracture is seen. CT ABDOMEN PELVIS FINDINGS Hepatobiliary: No focal liver abnormality is seen. No gallstones, gallbladder wall thickening, or biliary dilatation. Pancreas: Unremarkable. No pancreatic ductal dilatation or surrounding inflammatory changes. Spleen: Normal in size without focal abnormality. Adrenals/Urinary Tract: Adrenal glands are unremarkable. Kidneys are normal, without renal calculi, focal lesion, or hydronephrosis. Bladder is unremarkable. Stomach/Bowel: There is a moderate to large hiatal hernia. Appendix appears normal. No evidence of bowel wall thickening, distention, or inflammatory changes. Noninflamed diverticula are seen within the proximal sigmoid colon. Vascular/Lymphatic: No significant vascular findings are present. No enlarged abdominal or pelvic lymph nodes. Reproductive: Prostate is unremarkable. Other: No abdominal wall hernia or abnormality. No abdominopelvic ascites. Musculoskeletal: No acute or significant osseous findings. IMPRESSION: 1. Small anterior right pneumothorax. 2. Acute lateral fourth, fifth and sixth right rib fractures. 3.  Trace amount of nonhemorrhagic pleural fluid seen on the right. 4. Moderate to large hiatal hernia. 5. Noninflamed diverticula within the proximal sigmoid colon. Electronically Signed   By: Virgina Norfolk M.D.   On: 01/18/2020 17:49   DG Chest Port 1 View  Result Date: 01/18/2020 CLINICAL DATA:  Status post trauma. EXAM: PORTABLE CHEST 1 VIEW COMPARISON:  June 29, 2019 FINDINGS: There is no evidence of acute infiltrate, pleural effusion or pneumothorax. The heart size and mediastinal contours are within normal limits. Lateral sixth and seventh right rib fractures are seen. Multilevel degenerative changes are noted throughout the thoracic spine. IMPRESSION: Lateral sixth and seventh right rib fractures. Electronically Signed   By: Virgina Norfolk M.D.   On: 01/18/2020 16:50   CT MAXILLOFACIAL WO CONTRAST  Result Date: 01/18/2020 CLINICAL DATA:  Status post trauma. EXAM: CT HEAD WITHOUT CONTRAST CT MAXILLOFACIAL WITHOUT CONTRAST TECHNIQUE: Multidetector CT imaging of the head and maxillofacial structures were performed using the standard protocol without intravenous contrast. Multiplanar CT image reconstructions of the maxillofacial structures were also generated. COMPARISON:  None. FINDINGS: CT HEAD FINDINGS Brain: No evidence of acute infarction, hemorrhage, hydrocephalus, extra-axial collection or mass effect. A 1.0 cm x 0.9 cm x 1.0 cm area of white matter low attenuation is seen within the posterolateral aspect of the cerebellum on the right (axial CT image 9 and 10, CT series number 4). Vascular: No hyperdense vessel or unexpected calcification. Skull: Normal. Negative for fracture or focal lesion. Other: Mild left frontal scalp soft tissue swelling is noted. There is marked severity bilateral maxillary sinus mucosal thickening, with poorly defined medial walls of the bilateral maxillary sinuses. Moderate severity bilateral ethmoid sinus mucosal thickening is also seen. CT MAXILLOFACIAL FINDINGS  Osseous: No fracture or mandibular dislocation. No destructive process. Orbits: Negative. No traumatic or inflammatory finding. Sinuses: There is marked severity bilateral maxillary sinus mucosal thickening, with poorly defined medial walls of the bilateral  maxillary sinuses. Moderate severity bilateral ethmoid sinus mucosal thickening is also seen. Soft tissues: There is mild left frontal scalp soft tissue swelling. IMPRESSION: 1. Small area of white matter low attenuation within the posterolateral aspect of the cerebellum on the right, which may represent an area of chronic infarct. Correlation with MRI is recommended. 2. Mild left frontal scalp soft tissue swelling. 3. Bilateral maxillary sinus and bilateral ethmoid sinus disease. Electronically Signed   By: Virgina Norfolk M.D.   On: 01/18/2020 17:38    Anti-infectives: Anti-infectives (From admission, onward)   None      Assessment/Plan: s/p * No surgery found * clear liquids if he can tolerate  Pulm toilet. Right rib fxs, 6 and 7. Pain management PT Left wrist fx. Per ortho cxr pending  LOS: 1 day    Autumn Messing III 01/19/2020
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
## 2020-01-20 LAB — MRSA PCR SCREENING: MRSA by PCR: NEGATIVE

## 2020-01-20 MED ORDER — LIDOCAINE HCL 2 % IJ SOLN
10.0000 mL | Freq: Once | INTRAMUSCULAR | Status: DC
  Filled 2020-01-20: qty 10

## 2020-01-20 MED ORDER — METHOCARBAMOL 500 MG PO TABS
1000.0000 mg | ORAL_TABLET | Freq: Three times a day (TID) | ORAL | Status: DC
  Administered 2020-01-20 – 2020-01-21 (×4): 1000 mg via ORAL
  Filled 2020-01-20 (×4): qty 2

## 2020-01-20 MED ORDER — HYDROMORPHONE HCL 1 MG/ML IJ SOLN: 0.5000 mg | Freq: Four times a day (QID) | INTRAMUSCULAR | Status: DC | PRN

## 2020-01-20 NOTE — Procedures (Addendum)
Procedure Name: Laceration Repair  Indication: Reduce risk of infection Location: lower lip Length: 3 cm Pre-Procedure Diagnosis: Laceration Post-Procedure Diagnosis: Repaired Laceration Informed consent was obtained before procedure started.  PROCEDURE: The appropriate timeout was taken. The area was prepped and draped in the usual sterile fashion. Local anesthesia was achieved using 5cc of  Lidocaine 2% without epinephrine. The wound was copiously irrigated. #3 5-0 Chromic gut interrupted sutures were placed.  Estimated blood loss was less than 0.5 mL. Anticipatory guidance, as well as standard post-procedure care, was explained. The patient tolerated the procedure well without complications.  Norm Parcel , [facility] 01/20/2020, 1:44 PM Please see Amion for pager number during day hours 7:00am-4:30pm
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
## 2020-01-20 NOTE — Discharge Instructions (Signed)
Rib Fracture  A rib fracture is a break or crack in one of the bones of the ribs. The ribs are like a cage that goes around your upper chest. A broken or cracked rib is often painful, but most do not cause other problems. Most rib fractures usually heal on their own in 1-3 months. Follow these instructions at home: Managing pain, stiffness, and swelling  If directed, apply ice to the injured area. ? Put ice in a plastic bag. ? Place a towel between your skin and the bag. ? Leave the ice on for 20 minutes, 2-3 times a day.  Take over-the-counter and prescription medicines only as told by your doctor. Activity  Avoid activities that cause pain to the injured area. Protect your injured area.  Slowly increase activity as told by your doctor. General instructions  Do deep breathing as told by your doctor. You may be told to: ? Take deep breaths many times a day. ? Cough many times a day while hugging a pillow. ? Use a device (incentive spirometer) to do deep breathing many times a day.  Drink enough fluid to keep your pee (urine) clear or pale yellow.  Do not wear a rib belt or binder. These do not allow you to breathe deeply.  Keep all follow-up visits as told by your doctor. This is important. Contact a doctor if:  You have a fever. Get help right away if:  You have trouble breathing.  You are short of breath.  You cannot stop coughing.  You cough up thick or bloody spit (sputum).  You feel sick to your stomach (nauseous), throw up (vomit), or have belly (abdominal) pain.  Your pain gets worse and medicine does not help. Summary  A rib fracture is a break or crack in one of the bones of the ribs.  Apply ice to the injured area and take medicines for pain as told by your doctor.  Take deep breaths and cough many times a day. Hug a pillow every time you cough. This information is not intended to replace advice given to you by your health care provider. Make sure you  discuss any questions you have with your health care provider. Document Revised: 08/11/2017 Document Reviewed: 11/29/2016 Elsevier Patient Education  [place].   Wrist Splint, Adult A wrist splint holds your wrist in a position so it does not move. A splint supports your wrist like a cast, but it is not stiff like a cast (is flexible). You can take it off or make it looser. You may need a wrist splint if you hurt your wrist or have swelling in your wrist. A splint can:  Support your wrist.  Protect your wrist when it is hurt (injured).  Help you not hurt your wrist again.  Make your wrist stay still and not move.  Lessen pain.  Help your wrist heal. It is important to wear your splint as told by your doctor. This helps you make sure that your wrist heals the right way. What are the risks? If you wear your splint too tight or you have a lot of swelling, blood may not be able to go to your wrist or hand. If this happens, you can get a condition called compartment syndrome. It can be dangerous and cause damage that lasts. Symptoms are:  Pain in your wrist that gets worse.  Tingling.  Having no feeling in your wrist or hand. This is called numbness.  Changes in skin color.  The skin may look very light (pale) or kind of blue.  Cold fingers. Other risks of wearing a splint may be:  A stiff wrist.  A weak wrist.  Skin irritation that can cause: ? Itching. ? Rash. ? Sores. ? Infection. How to use your wrist splint Your wrist splint should be tight enough to support your wrist. It should not block blood from going to your hand or wrist. Your doctor will tell you how to wear your wrist splint and how long to wear it. Splint wear  Wear the splint as told by your doctor. Only take it off as told by your doctor.  Loosen the splint if your fingers tingle, get numb, or turn cold and blue.  Keep the splint clean.  If the splint is not waterproof: ? Do not let it get  wet. ? Cover it with a watertight covering when you take a bath or a shower  Do not stick anything inside the splint to scratch your skin. Doing that increases your risk of infection.  Check the skin under the splint every time you take it off. Check for any redness or blisters. Tell your doctor about any skin problems. Managing pain, stiffness, and swelling   If directed, put ice on the injured area. ? If you a have a splint that can be taken off, take it off as told by your doctor. ? Put ice in a plastic bag. ? Place a towel between your skin and the bag. ? Leave the ice on for 20 minutes, 2-3 times a day.  Move your fingers often to avoid stiffness and to lessen swelling.  Raise (elevate) the injured area above the level of your heart while you are sitting or lying down. Activity  Return to your normal activities as told by your doctor. Ask your doctor what activities are safe for you.  Do exercises as told by your doctor.  Ask your doctor when it is safe to drive with a splint on your wrist. General instructions  Do not use the injured limb to support (bear) your body weight until your doctor says that you can.  Do not put pressure on any part of the splint until it is fully hardened. This may take many hours.  Do not use any products that have nicotine or tobacco in them, such as cigarettes and e-cigarettes. If you need help quitting, ask your doctor.  Take over-the-counter and prescription medicines only as told by your doctor.  Keep all follow-up visits as told by your doctor. This is important. Get help if:  You have wrist pain or swelling that does not go away.  The skin around or under your splint gets red, itchy, or moist.  You have chills or fever.  Your splint feels too tight or too loose.  Your splint breaks. Get help right away if:  You have pain that gets worse.  You have tingling and numbness.  You have changes in skin color, including paleness or  a bluish color.  Your fingers are cold. Summary  A wrist splint is a flexible device that supports your wrist and keeps your wrist from moving.  It is important to wear your splint as told by your doctor. This helps to make sure that your wrist heals correctly.  Icing, moving your fingers, and raising your wrist above the level of your heart will help you manage pain, stiffness, and swelling.  Your wrist splint should be tight enough to support your wrist.  It should not block your blood supply.  Get help right away if your fingers tingle, get numb, or turn cold and blue. Loosen the splint right away. This information is not intended to replace advice given to you by your health care provider. Make sure you discuss any questions you have with your health care provider. Document Revised: 12/17/2018 Document Reviewed: 11/16/2016 Elsevier Patient Education  [place].
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
## 2020-01-20 NOTE — Progress Notes (Signed)
   Trauma/Critical Care Follow Up Note  Subjective:    Overnight Issues:   Objective:  Vital signs for last 24 hours: Temp:  [98.2 F (36.8 C)-99 F (37.2 C)] 98.4 F (36.9 C) (05/10 0836) Pulse Rate:  [66-80] 66 (05/10 0836) Resp:  [15-20] 20 (05/10 0836) BP: (111-119)/(60-82) 116/74 (05/10 0836) SpO2:  [93 %-97 %] 93 % (05/10 0836)  Hemodynamic parameters for last 24 hours:    Intake/Output from previous day: 05/09 0701 - 05/10 0700 In: 2019.9 [P.O.:1320; I.V.:699.9] Out: 3090 [id number]  Intake/Output this shift: No intake/output data recorded.  Vent settings for last 24 hours:    Physical Exam:  Gen: comfortable, no distress Neuro: non-focal exam HEENT: PERRL, lip laceration inner left lower lip with some necrotic tissue Neck: supple CV: RRR Pulm: unlabored breathing Abd: soft, NT GU: clear yellow urine Extr: wwp, no edema   Results for orders placed or performed during the hospital encounter of 01/18/20 (from the past 24 hour(s))  MRSA PCR Screening     Status: None   Collection Time: 01/20/20  5:34 AM   Specimen: Nasopharyngeal  Result Value Ref Range   MRSA by PCR NEGATIVE NEGATIVE    Assessment & Plan: The plan of care was discussed with the bedside nurse for the day, who is in agreement with this plan and no additional concerns were raised.   Present on Admission: . Right rib fracture . Traumatic closed fracture of ulnar styloid with minimal displacement, left, initial encounter    LOS: 2 days   Additional comments:I reviewed the patient's new clinical lab test results.   and I reviewed the patients new imaging test results.    [facility]  Concussion - SLP eval Right rib fracture 4-6 with occult pneumothorax - IS/pulm toilet, pain control  Left ulnar styloid fracture - ortho c/s (Dr. 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 I have reviewed this patient's available data, including medical history, events of note, physical examination and test results as part of my evaluation.

## 2020-01-20 NOTE — Social Work (Signed)
CSW met with pt at bedside. CSW introduced self and explained her role. CSW completed sbirt with pt.  Pt scored a 1 on the sbirt scale. Pt stated he will have 1 beer about once a month. Pt stated he does not have a problem with his alcohol use. Pt denied substance use. Pt did not need resources at this time.  Emeterio Reeve, Latanya Presser, [place] Social Worker [phone]

## 2020-01-21 ENCOUNTER — Encounter: Payer: Self-pay | Admitting: Emergency Medicine

## 2020-01-21 MED ORDER — GABAPENTIN 100 MG PO CAPS: 100.0000 mg | ORAL_CAPSULE | Freq: Three times a day (TID) | ORAL | 0 refills | Status: AC

## 2020-01-21 MED ORDER — OXYCODONE HCL 10 MG PO TABS: 5.0000 mg | ORAL_TABLET | Freq: Four times a day (QID) | ORAL | 0 refills | Status: AC | PRN

## 2020-01-21 MED ORDER — ACETAMINOPHEN 500 MG PO TABS: 1000.0000 mg | ORAL_TABLET | Freq: Four times a day (QID) | ORAL | Status: AC | PRN

## 2020-01-21 MED ORDER — METHOCARBAMOL 500 MG PO TABS: 1000.0000 mg | ORAL_TABLET | Freq: Three times a day (TID) | ORAL | 1 refills | Status: AC
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
## 2020-01-21 NOTE — Progress Notes (Signed)
Orthopedic Tech Progress Note Patient Details:  Julian Lopez March 25, 1961 [id number]  Ortho Devices Type of Ortho Device: Sling immobilizer Ortho Device/Splint Location: LUE Ortho Device/Splint Interventions: Ordered, Application   Post Interventions Patient Tolerated: Well Instructions Provided: Care of device   [name] 01/21/2020, 12:05 PM

## 2020-01-21 NOTE — Progress Notes (Signed)
Discharge instructions given. Patient verbalized understanding and all questions were understanding answered.

## 2022-01-06 IMAGING — CT CT HEAD W/O CM
4 of 5 series · 14 of 47 positions shown, 16 images · non-contrast
Comparison: None.

CLINICAL DATA: Status post trauma.

EXAM:
CT HEAD WITHOUT CONTRAST
CT MAXILLOFACIAL WITHOUT CONTRAST
TECHNIQUE: Multidetector CT imaging of the head and maxillofacial structures
were performed using the standard protocol without intravenous
contrast. Multiplanar CT image reconstructions of the maxillofacial
structures were also generated.

[Series 4: head without · axial · non-contrast · 0.46mm/px · z∈[-45,+55]mm · 5 of 31 slices shown, 7 images]
[im 6/31  brain]
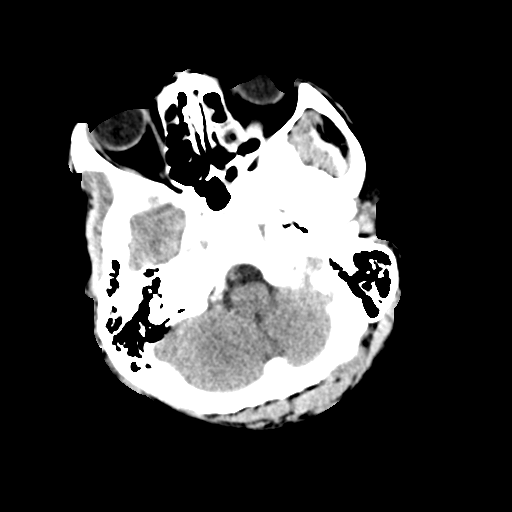
[im 6/31  bone]
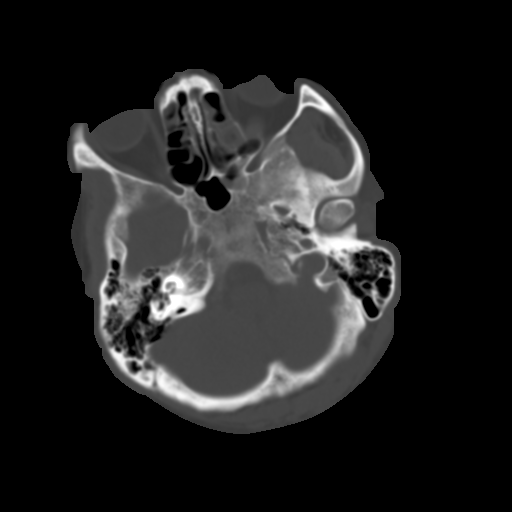
[im 11/31  brain]
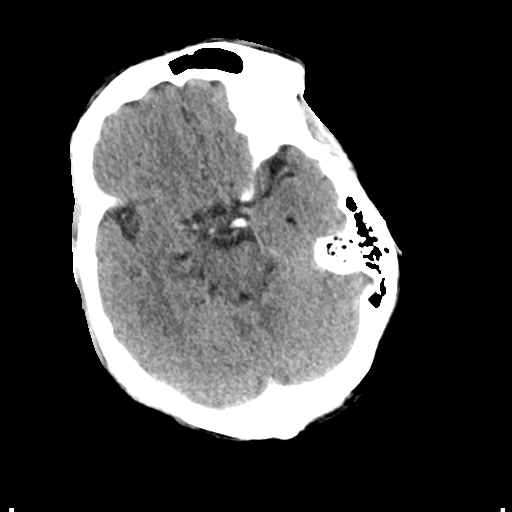
[im 16/31  brain]
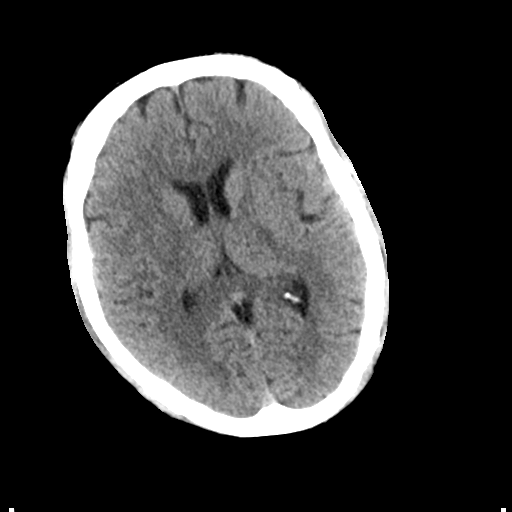
[im 21/31  brain]
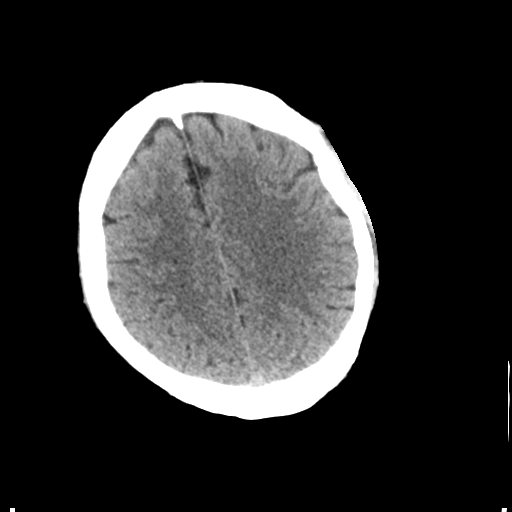
[im 26/31  brain]
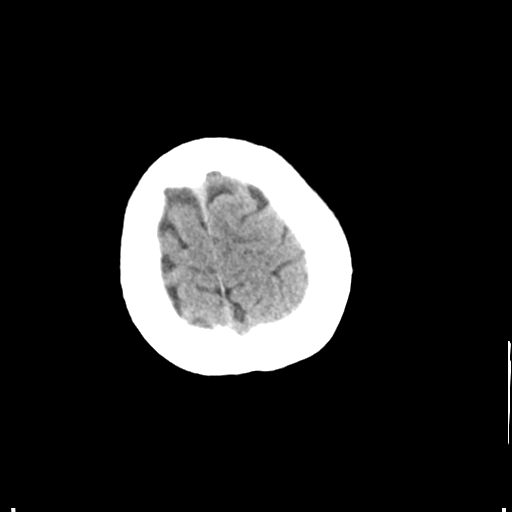
[im 26/31  bone]
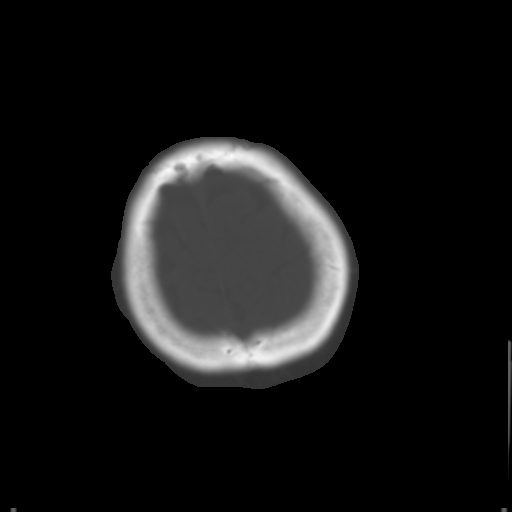

[Series 5: head without cor · coronal · non-contrast · 0.35mm/px · 3 of 63 slices shown]
[im 21/63  brain]
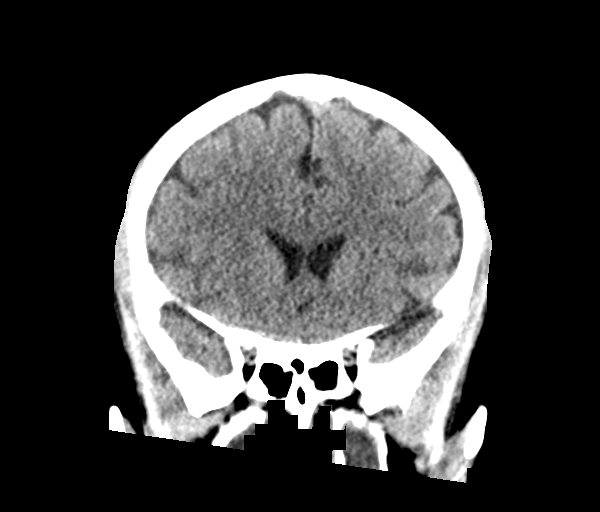
[im 28/63  brain]
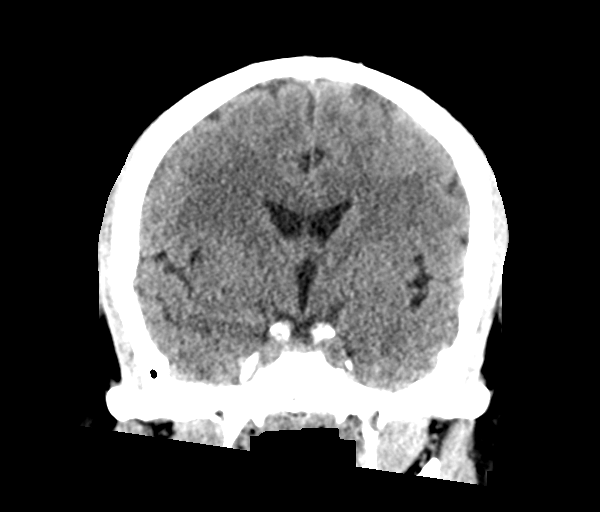
[im 35/63  brain]
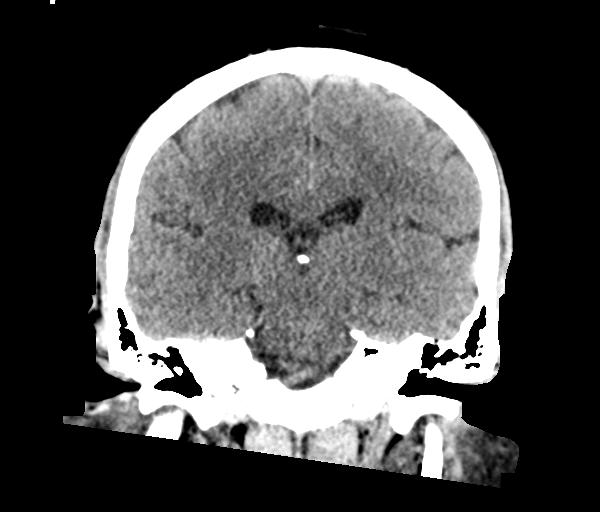

[Series 6: head without sag · sagittal · non-contrast · 0.32mm/px · 3 of 55 slices shown]
[im 19/55  brain]
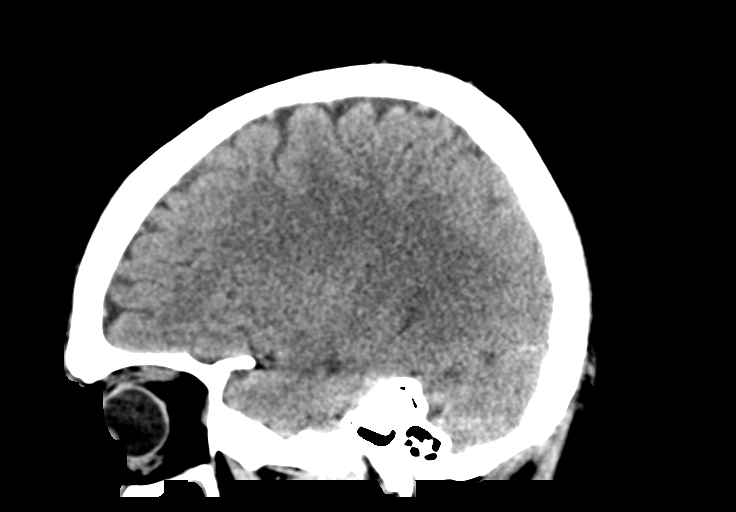
[im 28/55  brain]
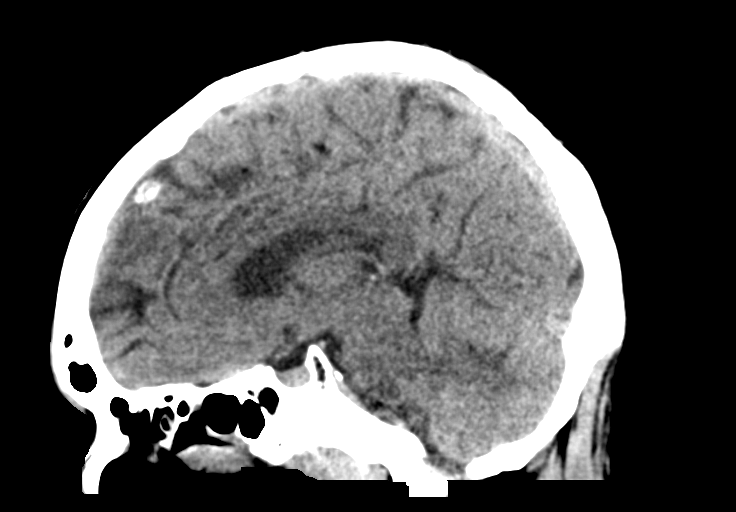
[im 37/55  brain]
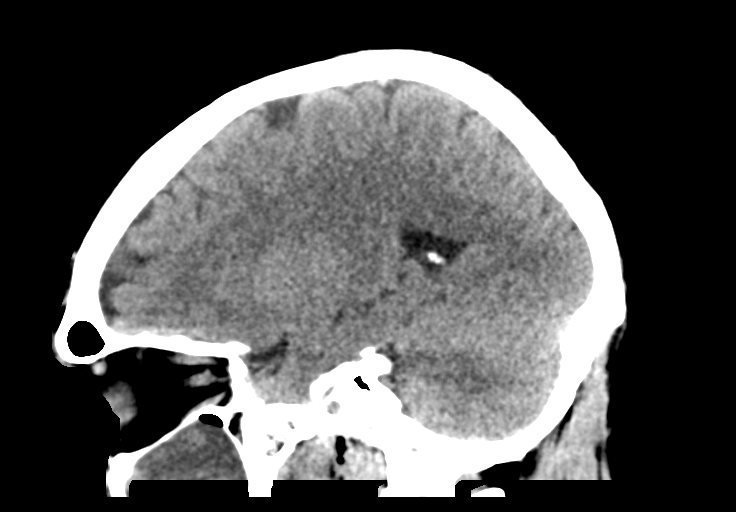

[Series 7: axial · axial · 0.42mm/px · z∈[-55,-5]mm · 3 of 29 slices shown]
[im 5/29  brain]
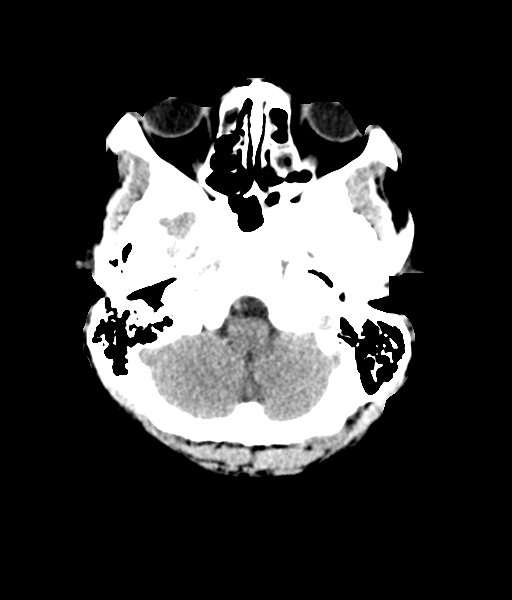
[im 10/29  brain]
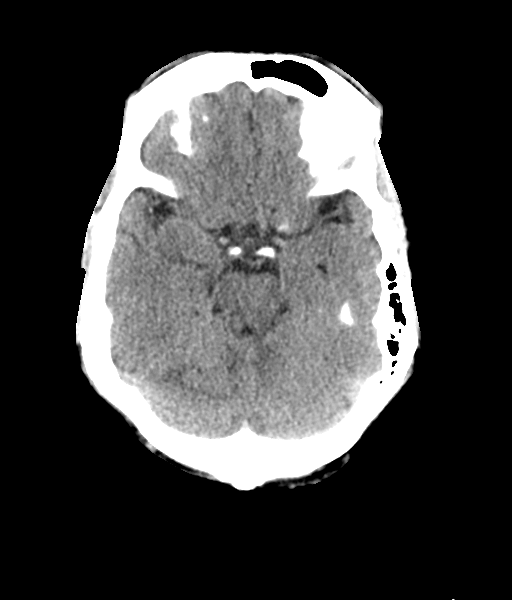
[im 15/29  brain]
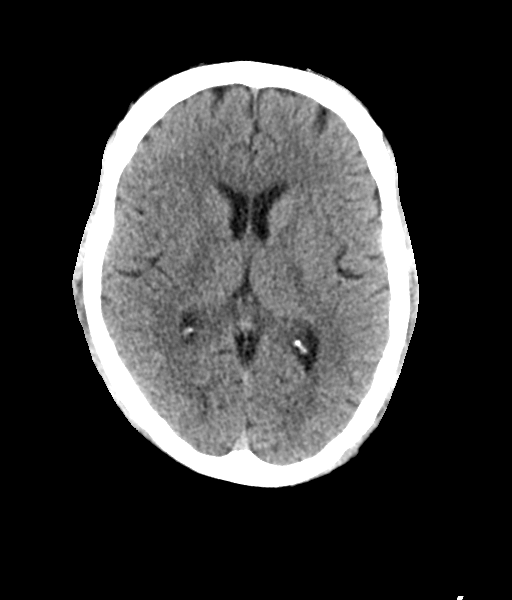

[14 of 47 positions shown; findings below may reference images not displayed]

FINDINGS: CT HEAD FINDINGS

Brain: No evidence of acute infarction, hemorrhage, hydrocephalus,
extra-axial collection or mass effect.

A 1.0 cm x 0.9 cm x 1.0 cm area of white matter low attenuation is
seen within the posterolateral aspect of the cerebellum on the right
(axial CT image 9 and 10, CT series number 4).

Vascular: No hyperdense vessel or unexpected calcification.

Skull: Normal. Negative for fracture or focal lesion.

Other: Mild left frontal scalp soft tissue swelling is noted.

There is marked severity bilateral maxillary sinus mucosal
thickening, with poorly defined medial walls of the bilateral
maxillary sinuses. Moderate severity bilateral ethmoid sinus mucosal
thickening is also seen.

CT MAXILLOFACIAL FINDINGS

Osseous: No fracture or mandibular dislocation. No destructive
process.

Orbits: Negative. No traumatic or inflammatory finding.

Sinuses: There is marked severity bilateral maxillary sinus mucosal
thickening, with poorly defined medial walls of the bilateral
maxillary sinuses. Moderate severity bilateral ethmoid sinus mucosal
thickening is also seen.

Soft tissues: There is mild left frontal scalp soft tissue swelling.
IMPRESSION: 1. Small area of white matter low attenuation within the
posterolateral aspect of the cerebellum on the right, which may
represent an area of chronic infarct. Correlation with MRI is
recommended.
2. Mild left frontal scalp soft tissue swelling.
3. Bilateral maxillary sinus and bilateral ethmoid sinus disease.
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
## 2022-02-22 ENCOUNTER — Other Ambulatory Visit: Payer: Self-pay

## 2022-02-22 ENCOUNTER — Emergency Department: Payer: Self-pay

## 2022-02-22 ENCOUNTER — Ambulatory Visit (HOSPITAL_COMMUNITY)
Admission: AD | Admit: 2022-02-22 | Discharge: 2022-02-22 | Disposition: A | Discharge status: Home / Self Care | Payer: Self-pay | Source: Other Acute Inpatient Hospital | Attending: Emergency Medicine | Admitting: Emergency Medicine

## 2022-02-22 ENCOUNTER — Emergency Department
Admission: EM | Admit: 2022-02-22 | Discharge: 2022-02-22 | Disposition: A | Discharge status: Other Acute Inpatient Hospital | Payer: Self-pay | Attending: Emergency Medicine | Admitting: Emergency Medicine

## 2022-02-22 DIAGNOSIS — I10 Essential (primary) hypertension: Secondary | ICD-10-CM | POA: Insufficient documentation

## 2022-02-22 DIAGNOSIS — R220 Localized swelling, mass and lump, head: Secondary | ICD-10-CM | POA: Insufficient documentation

## 2022-02-22 DIAGNOSIS — C719 Malignant neoplasm of brain, unspecified: Secondary | ICD-10-CM | POA: Insufficient documentation

## 2022-02-22 DIAGNOSIS — R42 Dizziness and giddiness: Secondary | ICD-10-CM | POA: Insufficient documentation

## 2022-02-22 DIAGNOSIS — G919 Hydrocephalus, unspecified: Secondary | ICD-10-CM | POA: Insufficient documentation

## 2022-02-22 DIAGNOSIS — G9389 Other specified disorders of brain: Secondary | ICD-10-CM

## 2022-02-22 DIAGNOSIS — R7309 Other abnormal glucose: Secondary | ICD-10-CM | POA: Insufficient documentation

## 2022-02-22 LAB — COMPREHENSIVE METABOLIC PANEL
ALT: 20 U/L (ref 0–44)
AST: 15 U/L (ref 15–41)
Albumin: 4 g/dL (ref 3.5–5.0)
Alkaline Phosphatase: 57 U/L (ref 38–126)
Anion gap: 6 (ref 5–15)
BUN: 11 mg/dL (ref 6–20)
CO2: 22 mmol/L (ref 22–32)
Calcium: 9.2 mg/dL (ref 8.9–10.3)
Chloride: 109 mmol/L (ref 98–111)
Creatinine, Ser: 0.85 mg/dL (ref 0.61–1.24)
GFR, Estimated: >60 mL/min (ref >60)
Glucose, Bld: 97 mg/dL (ref 70–99)
Potassium: 3.6 mmol/L (ref 3.5–5.1)
Sodium: 137 mmol/L (ref 135–145)
Total Bilirubin: 0.6 mg/dL (ref 0.3–1.2)
Total Protein: 7.8 g/dL (ref 6.5–8.1)

## 2022-02-22 LAB — CBC
HCT: 42.3 % (ref 39.0–52.0)
Hemoglobin: 13.9 g/dL (ref 13.0–17.0)
MCH: 26.5 pg (ref 26.0–34.0)
MCHC: 32.9 g/dL (ref 30.0–36.0)
MCV: 80.6 fL (ref 80.0–100.0)
Platelets: 308 10*3/uL (ref 150–400)
RBC: 5.25 MIL/uL (ref 4.22–5.81)
RDW: 14.2 % (ref 11.5–15.5)
WBC: 9.4 10*3/uL (ref 4.0–10.5)
nRBC: 0 % (ref 0.0–0.2)

## 2022-02-22 LAB — DIFFERENTIAL
Abs Immature Granulocytes: 0.02 10*3/uL (ref 0.00–0.07)
Basophils Absolute: 0 10*3/uL (ref 0.0–0.1)
Basophils Relative: 0 %
Eosinophils Absolute: 0 10*3/uL (ref 0.0–0.5)
Eosinophils Relative: 0 %
Immature Granulocytes: 0 %
Lymphocytes Relative: 16 %
Lymphs Abs: 1.5 10*3/uL (ref 0.7–4.0)
Monocytes Absolute: 0.5 10*3/uL (ref 0.1–1.0)
Monocytes Relative: 5 %
Neutro Abs: 7.3 10*3/uL (ref 1.7–7.7)
Neutrophils Relative %: 79 %

## 2022-02-22 LAB — TROPONIN I (HIGH SENSITIVITY): Troponin I (High Sensitivity): 3 ng/L (ref <18)

## 2022-02-22 LAB — PROTIME-INR
INR: 1 (ref 0.8–1.2)
Prothrombin Time: 13.5 seconds (ref 11.4–15.2)

## 2022-02-22 LAB — CBG MONITORING, ED: Glucose-Capillary: 111 mg/dL — ABNORMAL HIGH (ref 70–99)

## 2022-02-22 LAB — APTT: aPTT: 27 seconds (ref 24–36)

## 2022-02-22 NOTE — ED Provider Notes (Signed)
[facility] Provider Note    Event Date/Time   First MD Initiated Contact with Patient 02/22/22 1704     (approximate)   History   Hypertension   HPI  Julian Lopez is a 61 y.o. male otherwise healthy not on any medication who comes in with concerns for dizziness patient reports having 3 weeks of dizziness and headaches in the back of his head.  He reports that today he was trying to walk and he felt like he was going to fall which is what prompted him to come to the ER.  He denies actually falling and hitting his head.  He denies any weakness or any other concerns.   Physical Exam   Triage Vital Signs: ED Triage Vitals  Enc Vitals Group     BP 02/22/22 1636 130/77     Pulse Rate 02/22/22 1636 62     Resp 02/22/22 1636 16     Temp 02/22/22 1636 98.5 F (36.9 C)     Temp Source 02/22/22 1636 Oral     SpO2 02/22/22 1636 96 %     Weight 02/22/22 1528 205 lb (93 kg)     Height 02/22/22 1528 5' 9.5" (1.765 m)     Head Circumference --      Peak Flow --      Pain Score 02/22/22 1528 8     Pain Loc --      Pain Edu? --      Excl. in [place]? --     Most recent vital signs: Vitals:   02/22/22 1636  BP: 130/77  Pulse: 62  Resp: 16  Temp: 98.5 F (36.9 C)  SpO2: 96%     General: Awake, no distress.  CV:  Good peripheral perfusion.  Resp:  Normal effort.  Abd:  No distention.  Other:  Cranial nerves II through XII are intact.  Equal strength in arms and legs.  Finger-to-nose intact bilaterally.   ED Results / Procedures / Treatments   Labs (all labs ordered are listed, but only abnormal results are displayed) Labs Reviewed  CBG MONITORING, ED - Abnormal; Notable for the following components:      Result Value   Glucose-Capillary 111 (*)    All other components within normal limits  PROTIME-INR  APTT  CBC  DIFFERENTIAL  COMPREHENSIVE METABOLIC PANEL     EKG  My interpretation of EKG:  Normal sinus rate of 64 without any ST  elevation except for 1 and aVL and T wave inversion in lead III, aVF, normal intervals  RADIOLOGY I have reviewed the CT personally and patient has cystic mass  IMPRESSION: 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 This may represent a metastatic lesion, hemangioblastoma, or less likely, pilocytic astrocytoma, as these are less common in adults. An MRI with and without contrast is recommended for further evaluation.   These results were called by telephone at the time of interpretation on 02/22/2022 at 4:53 pm to provider [facility], who verbally acknowledged these results.  PROCEDURES:  Critical Care performed: No   .1-3 Lead EKG Interpretation  Performed by: Vanessa Winchester, MD Authorized by: Vanessa Oakbrook, MD     Interpretation: normal     ECG rate:  60   ECG rate assessment: normal     Rhythm: sinus rhythm     Ectopy:  none     Conduction: normal      MEDICATIONS ORDERED IN ED: Medications - No data to display   IMPRESSION / MDM / [place] / ED COURSE  I reviewed the triage vital signs and the nursing notes.   Patient's presentation is most consistent with acute presentation with potential threat to life or bodily function.   Differential includes mass, stroke, arrhythmia.  CMP normal coags normal CBC normal EKG had some nonspecific T wave inversions.  Added on troponin but seems less likely   Discussed with Dr. 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 This may represent a metastatic lesion, hemangioblastoma, or less likely, pilocytic astrocytoma, as these are less common in  adults. An MRI with and without contrast is recommended for further evaluation.  Pt accepted by Dr. Russella Dar Patel-patient will go ED ED for evaluation by neurosurgery  Patient and mother were updated at bedside   The patient is on the cardiac monitor to evaluate for evidence of arrhythmia and/or significant heart rate changes.      FINAL CLINICAL IMPRESSION(S) / ED DIAGNOSES   Final diagnoses:  Brain mass  Hydrocephalus, unspecified type ([place])     Rx / DC Orders   ED Discharge Orders     None        Note:  This document was prepared using Dragon voice recognition software and may include unintentional dictation errors.   Vanessa Deaver, MD 02/22/22 1807

## 2022-02-22 NOTE — ED Notes (Signed)
Report to [facility] charge and transport.  Pt denies pain or dizziness.  Pt being accepted by MD Posey Pronto, pt from dpt

## 2022-02-22 NOTE — ED Notes (Signed)
[facility]  transfer  center  called  per  Dr. Jari Pigg  MD

## 2022-02-22 NOTE — ED Notes (Signed)
Ct scan  [facility]

## 2022-02-22 NOTE — ED Notes (Signed)
Attempted to call report to [place], [phone]  RN unavailable, will call back in 5-10 minutes.

## 2022-02-22 NOTE — ED Notes (Signed)
Patient reports he has felt off balance leaving towards left, dizziness, and headache X2 weeks. In triage facial symmetry seems like it is off. Family asked to confirm and they report face is normal for patient.

## 2022-02-22 NOTE — ED Notes (Signed)
[facility]  called  to  transport  pt  to  [facility]  ED

## 2022-02-22 NOTE — ED Notes (Signed)
Pt in bed, pt denies dizziness or pain at this time.

## 2022-02-22 NOTE — ED Triage Notes (Addendum)
Pt comes with c/o HTN. Pt states some dizziness. Pt states he needs to be checked out and see if he has high Bp or not. Pt states this started couple weeks ago.

## 2022-12-12 IMAGING — CT CT HEAD W/O CM
4 series · 15 of 47 positions shown, 17 images · non-contrast
Comparison: 01/18/2020

CLINICAL DATA: Hypertension, dizziness



[Series 2: head wo · axial · 0.44mm/px · z∈[-83,+37]mm · 7 of 32 slices shown, 9 images]
[im 4/32  brain]
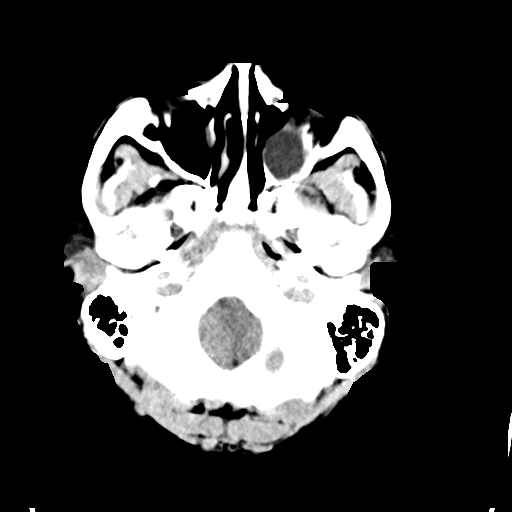
[im 4/32  bone]
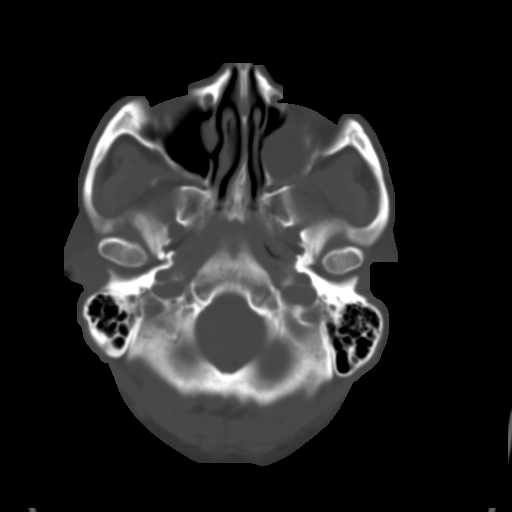
[im 8/32  brain]
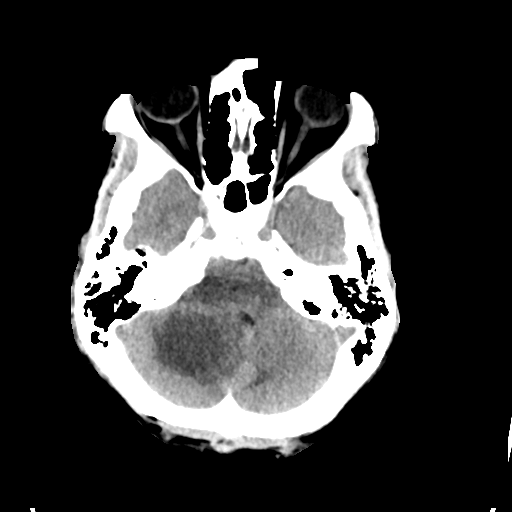
[im 12/32  brain]
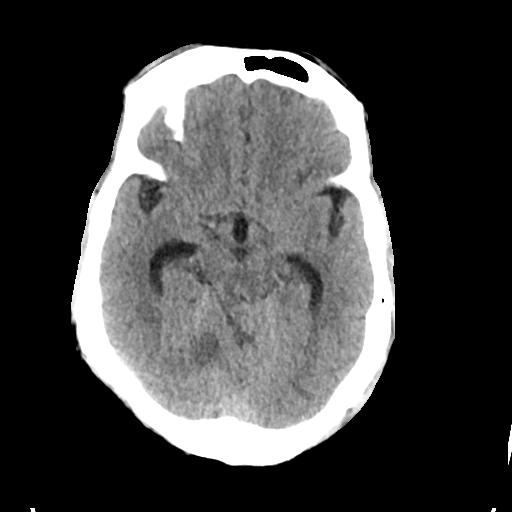
[im 16/32  brain]
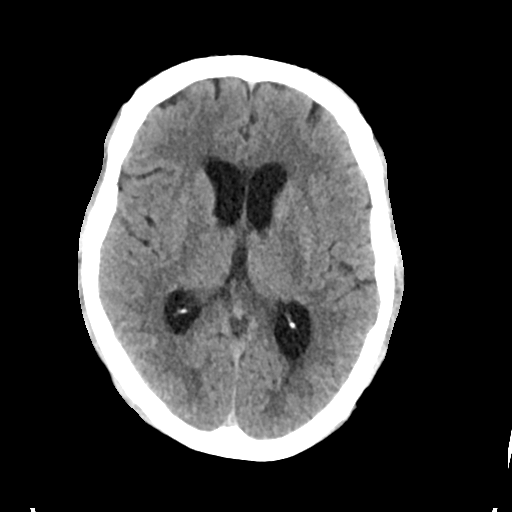
[im 20/32  brain]
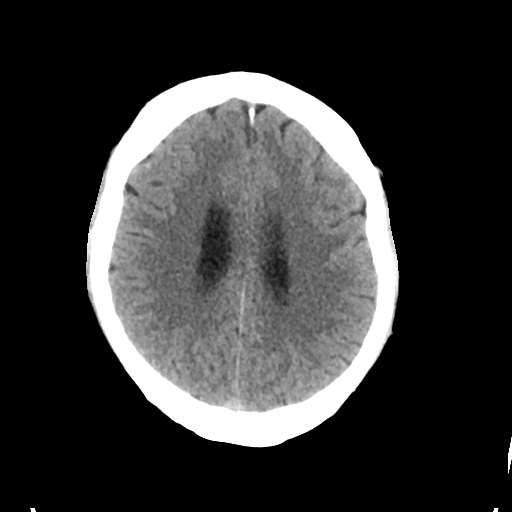
[im 20/32  bone]
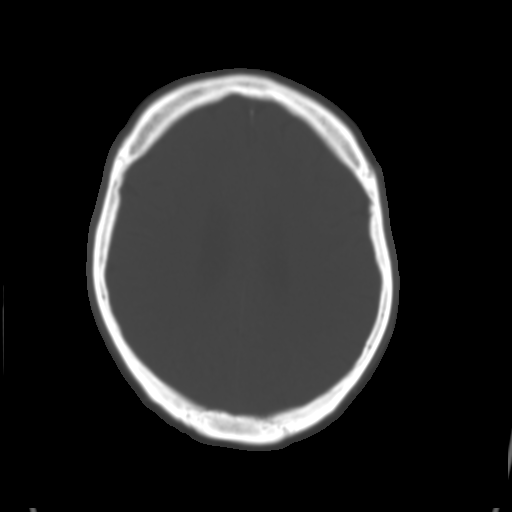
[im 24/32  brain]
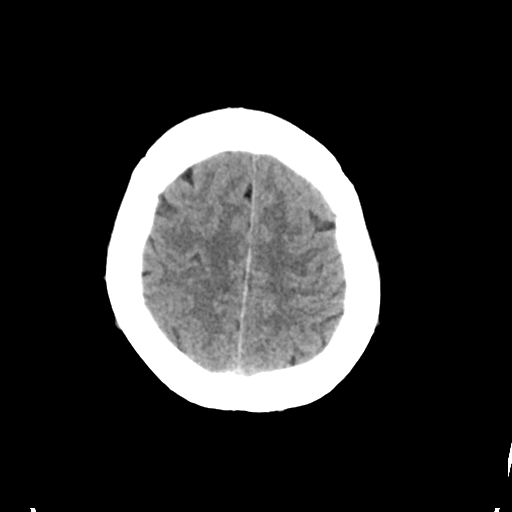
[im 28/32  brain]
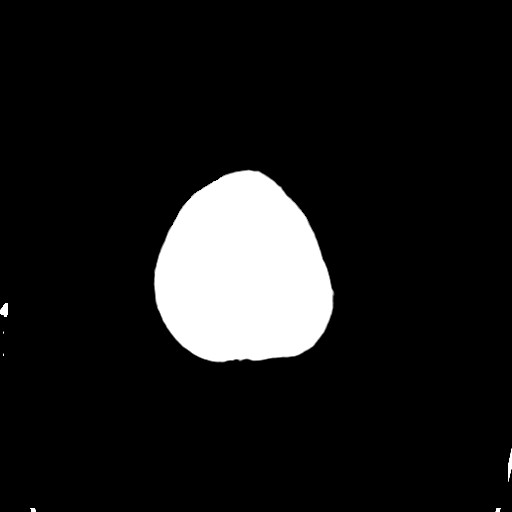

[Series 3: head bone · axial · 0.44mm/px · z∈[-84,-68]mm · 2 of 80 slices shown]
[im 8/80  bone]
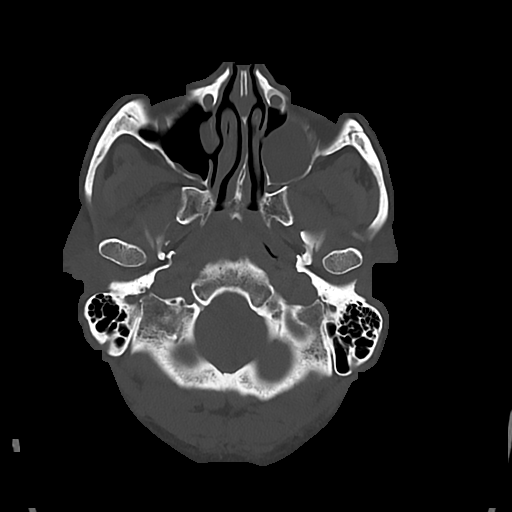
[im 16/80  bone]
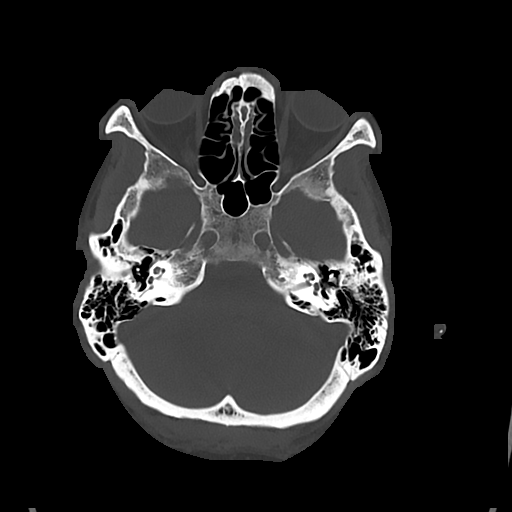

[Series 4: cor soft · coronal · 0.31mm/px · 3 of 68 slices shown]
[im 23/68  brain]
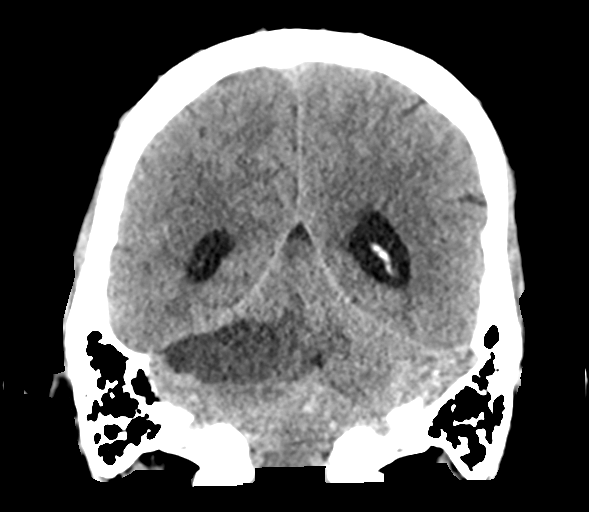
[im 30/68  brain]
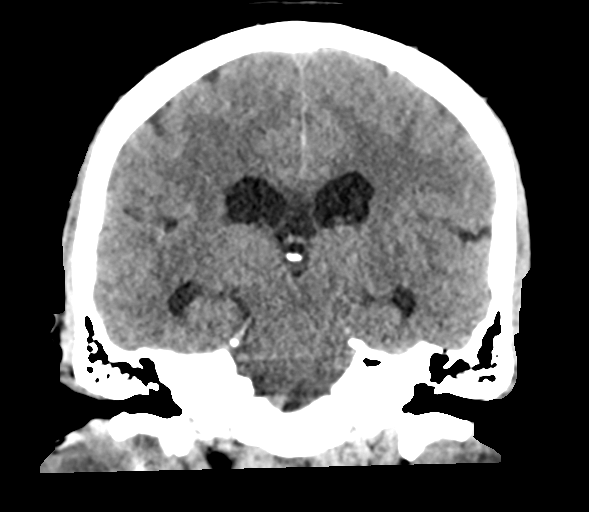
[im 38/68  brain]
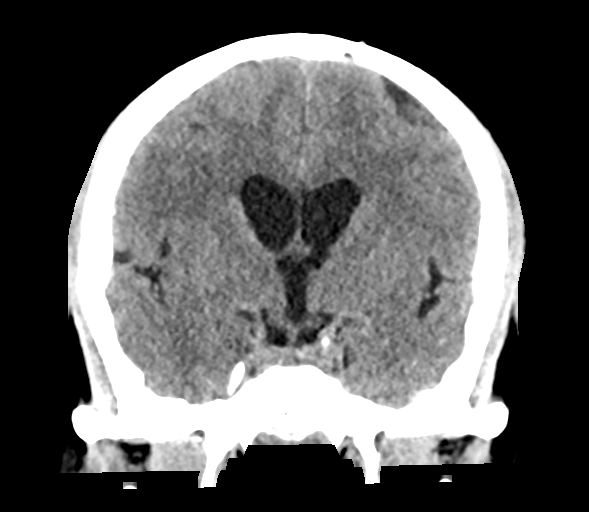

[Series 5: sag soft · sagittal · 0.31mm/px · 3 of 56 slices shown]
[im 19/56  brain]
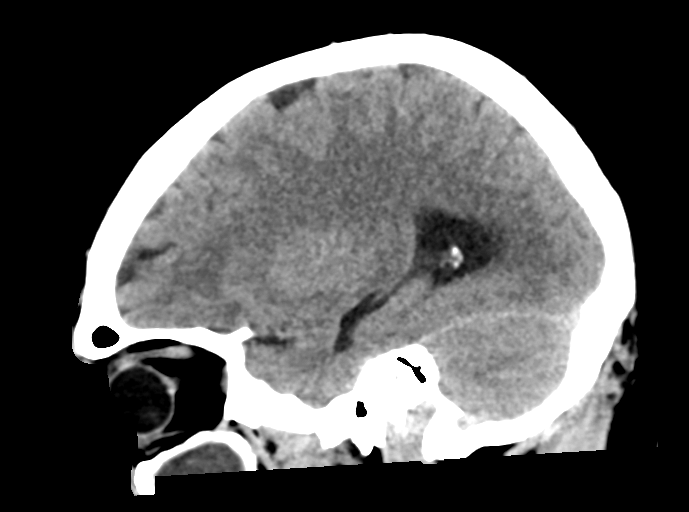
[im 28/56  brain]
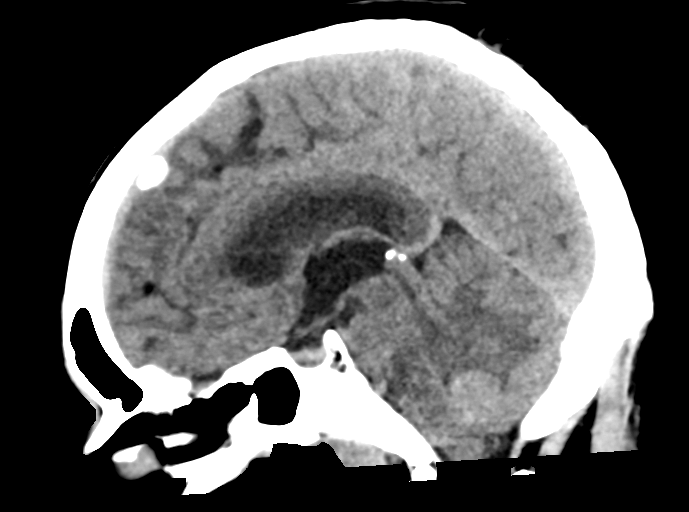
[im 37/56  brain]
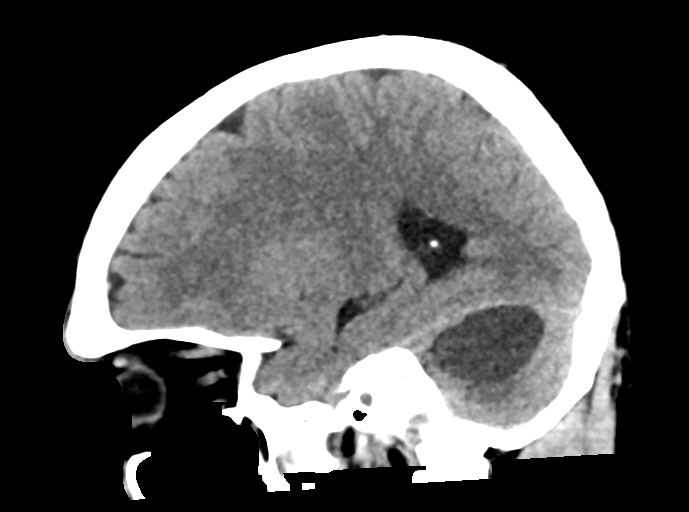

[15 of 47 positions shown; findings below may reference images not displayed]

FINDINGS: Brain: In the right cerebellum, there is a 4.4 x 3.3 x 2.6 cm cystic
lesion, with an associated 1.1 x 1.0 x 1.1 cm solid nodule (series
measured approximally 0.9 x 1.8 x 1.0 cm and the nodule measured
x 0.7 x 0.5 cm. Hypodensity about the left aspect of the mass, which
extends into the left cerebellum which effaces the fourth ventricle
and, to a lesser extent, the cerebral aqueduct. Interval increase in
the size of the lateral and third ventricles compared to 7477,
likely hydrocephalus, with some hypodensity along the anterior
aspect of the frontal horns, concerning for transependymal flow of
CSF.

No acute infarct, hemorrhage, or extra-axial collection.

Vascular: No hyperdense vessel.

Skull: No acute osseous abnormality.

Sinuses/Orbits: The orbits are unremarkable. Complete opacification
of the left maxillary sinus with partial opacification of the right
maxillary sinus. Hypoplastic right frontal sinus.

Other: The mastoids are well aerated.
IMPRESSION: Right cerebellar cystic lesion with mural nodule, which have
increased in size since 7477, now causing edema, mass effect,
cerebellar midline shift, and hydrocephalus with likely
transependymal flow of CSF. This may represent a metastatic lesion,
hemangioblastoma, or less likely, pilocytic astrocytoma, as these
are less common in adults. An MRI with and without contrast is
recommended for further evaluation.

These results were called by telephone at the time of interpretation
on 02/22/2022 at [DATE] to provider CHAKIF, who verbally acknowledged
these results.
